# Patient Record
Sex: Female | Born: 1990 | Race: White | Hispanic: No | Marital: Single | Smoking: Current some day smoker
Health system: Southern US, Community
[De-identification: ages and names within clinical notes are randomized; demographics above are authoritative.]

## PROBLEM LIST (undated history)

## (undated) DIAGNOSIS — D690 Allergic purpura: Secondary | ICD-10-CM

## (undated) DIAGNOSIS — J189 Pneumonia, unspecified organism: Secondary | ICD-10-CM

## (undated) DIAGNOSIS — F319 Bipolar disorder, unspecified: Secondary | ICD-10-CM

## (undated) DIAGNOSIS — K900 Celiac disease: Secondary | ICD-10-CM

## (undated) HISTORY — DX: Pneumonia, unspecified organism: J18.9

## (undated) HISTORY — DX: Allergic purpura: D69.0

---
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
## 2010-12-02 ENCOUNTER — Ambulatory Visit (INDEPENDENT_AMBULATORY_CARE_PROVIDER_SITE_OTHER): Payer: BC Managed Care – PPO | Admitting: Internal Medicine

## 2010-12-02 ENCOUNTER — Encounter: Payer: Self-pay | Admitting: Internal Medicine

## 2010-12-02 VITALS — BP 120/80 | HR 78 | Ht 65.5 in | Wt 180.0 lb

## 2010-12-02 DIAGNOSIS — R609 Edema, unspecified: Secondary | ICD-10-CM

## 2010-12-02 DIAGNOSIS — R140 Abdominal distension (gaseous): Secondary | ICD-10-CM | POA: Insufficient documentation

## 2010-12-02 DIAGNOSIS — Z8379 Family history of other diseases of the digestive system: Secondary | ICD-10-CM

## 2010-12-02 DIAGNOSIS — Z13220 Encounter for screening for lipoid disorders: Secondary | ICD-10-CM

## 2010-12-02 DIAGNOSIS — R143 Flatulence: Secondary | ICD-10-CM

## 2010-12-02 DIAGNOSIS — R635 Abnormal weight gain: Secondary | ICD-10-CM

## 2010-12-02 NOTE — Progress Notes (Signed)
  Subjective:    Patient ID: Laurie Stevens, female    DOB: [birth year], 20 y.o.   MRN: [id number]  HPI Patient comes in today with mom  for the above problems. Her last visit with [place] was almost 3 years ago. She is now finished her schooling in [place] and is living or an apartment with her boyfriend. She is down visiting her mother with some of these complaints and so office visit was made for about. Going back to [place] in a few days. On sprintec  For  2 month.  Seems to be doing well on this   Complaint of Digestion problem since November. 2011 described as constipation  Bloated and legs swell easy. Low sodium diet helps some.   Unrestored sleep.  At times some snoring.   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    No  Change in habits.  Gained 30 pounds  Since  November  Runs and  Exercises.  Diet tends to drink a lot of milk does eat nuts does not eat out much healthier than in the past.  Review of Systems Negative for chest pain shortness of breath does have a rash on her chest at times but not today. Irregular on medicine. No depression unusual joint swelling. She states that she has sometimes weight fluctuations of 4-5 pounds in a day.  She's gotten congested since she got back to [place] with her allergies.  Past Medical History  Diagnosis Date  . 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 Pneumonia 05/04    RML   No past surgical history on file.  reports that she has never smoked. She does not have any smokeless tobacco history on file. She reports that she drinks alcohol. She reports that she does not use illicit drugs. family history includes Celiac disease in her sister; Diabetes type II in an unspecified family member; Hypertension in her maternal grandmother and unspecified family member; and Thyroid disease in her sister. No Known Allergies       Objective:   Physical Exam Well-developed well-nourished in no acute distress. Mild upper respiratory congestion HEENT: Normocephalic ;atraumatic , Eyes;  PERRL, EOMs  Full, lids and conjunctiva clear,,Ears: no deformities, canals nl, TM landmarks normal, Nose: no deformity   Minimal mucoid clear  discharge  Mouth : OP clear without lesion or edema .tonsils 1-2 hypertrophied  Neck: no adenopathy. Chest:  Clear to A&P without wheezes rales or rhonchi CV:  S1-S2 no gallops or murmurs peripheral perfusion is normal Abdomen:  Sof,t normal bowel sounds without hepatosplenomegaly, no guarding rebound or masses no CVA tenderness Skin no acute rashes or petechia    Ext 1+ edema normal pulses and no skin changes       Assessment & Plan:  Weight gain Bloating  Edema comes nad goes  Better ? If lymphedema but no chronic changes   Sib has thyroid and celiac dx   Advise  Diet and exercise and labs.  Consider  Lactose intolerance . Call if has OSA sx.   Set up with PCP in [place] area .  Sign form  Mom can get information.

## 2010-12-02 NOTE — Patient Instructions (Signed)
Will notify you  of labs when available. Continue low salt diet and  Exercise.  Increase fiber   For constipation . Consider limiting mild or doing a lactose free diet for a few weeks and then add back  Some.  Yogurt does better than milk.

## 2010-12-03 LAB — HEPATIC FUNCTION PANEL
ALT: 14 U/L (ref 0–35)
AST: 15 U/L (ref 0–37)
Albumin: 3.2 g/dL — ABNORMAL LOW (ref 3.5–5.2)
Total Bilirubin: 0.2 mg/dL — ABNORMAL LOW (ref 0.3–1.2)

## 2010-12-03 LAB — BASIC METABOLIC PANEL
BUN: 12 mg/dL (ref 6–23)
CO2: 27 mEq/L (ref 19–32)
Chloride: 106 mEq/L (ref 96–112)
Glucose, Bld: 80 mg/dL (ref 70–99)
Potassium: 4.1 mEq/L (ref 3.5–5.1)

## 2010-12-03 LAB — CBC WITH DIFFERENTIAL/PLATELET
Basophils Relative: 3.3 % — ABNORMAL HIGH (ref 0.0–3.0)
Eosinophils Relative: 1.8 % (ref 0.0–5.0)
Lymphocytes Relative: 23.5 % (ref 12.0–46.0)
Monocytes Relative: 5.5 % (ref 3.0–12.0)
Neutrophils Relative %: 65.9 % (ref 43.0–77.0)
RBC: 4.2 Mil/uL (ref 3.87–5.11)
WBC: 8.6 10*3/uL (ref 4.5–10.5)

## 2010-12-03 LAB — LIPID PANEL
Total CHOL/HDL Ratio: 4
Triglycerides: 85 mg/dL (ref 0.0–149.0)

## 2010-12-03 LAB — T4, FREE: Free T4: 0.91 ng/dL (ref 0.60–1.60)

## 2010-12-08 ENCOUNTER — Encounter: Payer: Self-pay | Admitting: *Deleted

## 2010-12-09 LAB — CELIAC PANEL 10
Endomysial Screen: NEGATIVE
Gliadin IgA: 19.1 U/mL (ref <20)
Tissue Transglut Ab: 13.2 U/mL (ref <20)

## 2010-12-10 ENCOUNTER — Telehealth: Payer: Self-pay | Admitting: *Deleted

## 2010-12-10 NOTE — Telephone Encounter (Signed)
Mom would like pt's lab results, please.

## 2010-12-10 NOTE — Telephone Encounter (Signed)
Pt aware of results 

## 2016-01-24 ENCOUNTER — Ambulatory Visit: Payer: Self-pay | Admitting: Internal Medicine

## 2016-01-24 ENCOUNTER — Ambulatory Visit (INDEPENDENT_AMBULATORY_CARE_PROVIDER_SITE_OTHER): Payer: PRIVATE HEALTH INSURANCE | Admitting: Internal Medicine

## 2016-01-24 ENCOUNTER — Encounter: Payer: Self-pay | Admitting: Internal Medicine

## 2016-01-24 VITALS — BP 118/80 | Temp 99.0°F | Ht 65.5 in | Wt 155.2 lb

## 2016-01-24 DIAGNOSIS — R238 Other skin changes: Secondary | ICD-10-CM

## 2016-01-24 DIAGNOSIS — R140 Abdominal distension (gaseous): Secondary | ICD-10-CM

## 2016-01-24 DIAGNOSIS — Z8379 Family history of other diseases of the digestive system: Secondary | ICD-10-CM | POA: Diagnosis not present

## 2016-01-24 DIAGNOSIS — R233 Spontaneous ecchymoses: Secondary | ICD-10-CM

## 2016-01-24 LAB — CBC WITH DIFFERENTIAL/PLATELET
BASOS ABS: 81 {cells}/uL (ref 0–200)
Basophils Relative: 1 %
EOS ABS: 324 {cells}/uL (ref 15–500)
Eosinophils Relative: 4 %
HEMATOCRIT: 39.3 % (ref 35.0–45.0)
HEMOGLOBIN: 12.6 g/dL (ref 11.7–15.5)
LYMPHS ABS: 2187 {cells}/uL (ref 850–3900)
Lymphocytes Relative: 27 %
MCH: 30.7 pg (ref 27.0–33.0)
MCHC: 32.1 g/dL (ref 32.0–36.0)
MCV: 95.9 fL (ref 80.0–100.0)
MONO ABS: 729 {cells}/uL (ref 200–950)
MPV: 11.9 fL (ref 7.5–12.5)
Monocytes Relative: 9 %
NEUTROS PCT: 59 %
Neutro Abs: 4779 cells/uL (ref 1500–7800)
Platelets: 277 10*3/uL (ref 140–400)
RBC: 4.1 MIL/uL (ref 3.80–5.10)
RDW: 13.3 % (ref 11.0–15.0)
WBC: 8.1 10*3/uL (ref 3.8–10.8)

## 2016-01-24 LAB — PROTIME-INR
INR: 1
PROTHROMBIN TIME: 10.5 s (ref 9.0–11.5)

## 2016-01-24 LAB — APTT: aPTT: 26 s (ref 22–34)

## 2016-01-24 NOTE — Patient Instructions (Signed)
Will notify you  of labs when available.   May want to try off of the  Omega 3 incase adding the bleeding tendency . If lab ok and continues  We ,may get  A specialist  Hematologist to give opinion .  However your exam and hx is reassuring today .

## 2016-01-24 NOTE — Progress Notes (Signed)
Pre visit review using our clinic review tool, if applicable. No additional management support is needed unless otherwise documented below in the visit note.  Chief Complaint  Patient presents with  . Bruising  . Bilateral Leg Swelling    HPI: Laurie Stevens 25 y.o.  Last seen over 5 years ago comes in today for acute visit problem . She is now living in [place] but transitioning to move and at home for a while.  She comes in today at encouragement of her mom to look at her easy bruisability. She states she tends to bruise easily when things touch her skin. No specific bleeding otherwise.  Periods  Heavy first day and then barely  Otherwise . Normal  No nose bleeds  Sometimes bleed when brush teeth.    No recenet  Trips.  ibufprofen 4 per day when knee pain bad.  joint pain .  Knees  Both kness right is worse  . No swelling and exercising every day   Tends to get lower extremity leg swelling ever since she had at bedtime purpura years ago wears compression stockings at times and doesn't get bruising is much when she does that. Stomach  isses   Going vegan  Because of this .  Better now . When she avoids red meat and dairy. However she does take womens multivitamin  ,Omega 3     One  Day .  ROS: See pertinent positives and negatives per HPI. No chest pain shortness of breath otherwise difficulties since last seen.  Past Medical History  Diagnosis Date  . HSP ([facility]lein purpura) ([place]) 05/99    hs no renal involvement hosp with abd pain [place] rx  with prednisone  . Pneumonia 05/04    RML    Family History  Problem Relation Age of Onset  . Celiac disease Sister   . Thyroid disease Sister     autoimmune disease  . Diabetes type II    . Hypertension    . Hypertension Maternal Grandmother     Social History   Social History  . Marital Status: Single    Spouse Name: N/A  . Number of Children: N/A  . Years of Education: N/A   Social History Main Topics  . Smoking  status: Never Smoker   . Smokeless tobacco: Never Used  . Alcohol Use: No     Comment: rarely  . Drug Use: Yes     Comment: Marijuana  . Sexual Activity: Not Asked   Other Topics Concern  . 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  GENERAL: vitals reviewed and listed above, alert, oriented, appears well hydrated and in no acute distress HEENT: atraumatic, conjunctiva  clear, no obvious abnormalities on inspection of external  nose and ears OP : no lesion edema or exudate  NECK: no obvious masses on inspection palpation  LUNGS: clear to auscultation bilaterally, no wheezes, rales or rhonchi, good air movement CV: HRRR, no clubbing cyanosis or  peripheral edema nl cap refill  Abdomen:  Sof,t normal bowel sounds without hepatosplenomegaly, no guarding rebound or masses no CVA tenderness MS: moves all extremities without noticeable focal  Abnormality SKIN: Obvious ecchymosis pretty dramatic on both lower extremities asymmetrical from the knee down minimally tender no breaks in skin no petechiae. There is a linear ecchymosis on both anterior thighs where she states her laptop sitting for a while. Slight edema of legs nonpitting. Pulses appear intact pierced lip  No bruising scarrin PSYCH: pleasant and cooperative, no obvious depression or anxiety  ASSESSMENT AND PLAN:  Discussed the following assessment and plan:  Easy bruisability - Although lower extremity  pretty impressive amount of bruising from minimal contact including bruise related to laptop sitting on her thighs. Rest of exam normal - Plan: Basic metabolic panel, Hepatic function panel, TSH, Celiac panel 10, Protime-INR, Sedimentation rate, APTT, CBC with Differential/Platelet, CANCELED: CBC with Differential/Platelet  Family history of celiac disease  Abdominal bloating - better on vegan diet  taking  vitamins Plan follow up depending on results etc   Pat on menses today so no UA obtained  .  -Patient advised to return or notify health care team  if symptoms worsen ,persist or new concerns arise.  Patient Instructions  Will notify you  of labs when available.   May want to try off of the  Omega 3 incase adding the bleeding tendency . If lab ok and continues  We ,may get  A specialist  Hematologist to give opinion .  However your exam and hx is reassuring today .     Standley Brooking. Panosh M.D.

## 2016-01-25 LAB — HEPATIC FUNCTION PANEL
ALK PHOS: 51 U/L (ref 33–115)
ALT: 15 U/L (ref 6–29)
AST: 13 U/L (ref 10–30)
Albumin: 4.2 g/dL (ref 3.6–5.1)
BILIRUBIN DIRECT: 0.2 mg/dL (ref <=0.2)
BILIRUBIN INDIRECT: 0.5 mg/dL (ref 0.2–1.2)
BILIRUBIN TOTAL: 0.7 mg/dL (ref 0.2–1.2)
Total Protein: 6.9 g/dL (ref 6.1–8.1)

## 2016-01-25 LAB — TSH: TSH: 1.48 mIU/L

## 2016-01-25 LAB — BASIC METABOLIC PANEL
BUN: 10 mg/dL (ref 7–25)
CALCIUM: 9.3 mg/dL (ref 8.6–10.2)
CO2: 22 mmol/L (ref 20–31)
Chloride: 106 mmol/L (ref 98–110)
Creat: 0.81 mg/dL (ref 0.50–1.10)
Glucose, Bld: 82 mg/dL (ref 65–99)
POTASSIUM: 4 mmol/L (ref 3.5–5.3)
SODIUM: 140 mmol/L (ref 135–146)

## 2016-01-25 LAB — SEDIMENTATION RATE: Sed Rate: 1 mm/hr (ref 0–20)

## 2016-01-27 LAB — CELIAC PANEL 10
Endomysial Screen: NEGATIVE
GLIADIN IGA: 8 U (ref <20)
GLIADIN IGG: 5 U (ref <20)
IgA: 369 mg/dL (ref 81–463)
TISSUE TRANSGLUT AB: 1 U/mL (ref <6)
TISSUE TRANSGLUTAMINASE AB, IGA: 5 U/mL — AB (ref <4)

## 2016-03-03 ENCOUNTER — Emergency Department (HOSPITAL_COMMUNITY)
Admission: EM | Admit: 2016-03-03 | Discharge: 2016-03-04 | Disposition: A | Discharge status: Other Acute Inpatient Hospital | Payer: PRIVATE HEALTH INSURANCE | Attending: Emergency Medicine | Admitting: Emergency Medicine

## 2016-03-03 ENCOUNTER — Encounter (HOSPITAL_COMMUNITY): Payer: Self-pay | Admitting: Emergency Medicine

## 2016-03-03 DIAGNOSIS — R440 Auditory hallucinations: Secondary | ICD-10-CM | POA: Diagnosis present

## 2016-03-03 DIAGNOSIS — F3113 Bipolar disorder, current episode manic without psychotic features, severe: Secondary | ICD-10-CM | POA: Diagnosis present

## 2016-03-03 DIAGNOSIS — F22 Delusional disorders: Secondary | ICD-10-CM

## 2016-03-03 DIAGNOSIS — R443 Hallucinations, unspecified: Secondary | ICD-10-CM

## 2016-03-03 DIAGNOSIS — F918 Other conduct disorders: Secondary | ICD-10-CM | POA: Diagnosis not present

## 2016-03-03 DIAGNOSIS — R4689 Other symptoms and signs involving appearance and behavior: Secondary | ICD-10-CM

## 2016-03-03 DIAGNOSIS — F1290 Cannabis use, unspecified, uncomplicated: Secondary | ICD-10-CM | POA: Diagnosis not present

## 2016-03-03 DIAGNOSIS — Z79899 Other long term (current) drug therapy: Secondary | ICD-10-CM | POA: Diagnosis not present

## 2016-03-03 LAB — COMPREHENSIVE METABOLIC PANEL
ALK PHOS: 60 U/L (ref 38–126)
ALT: 15 U/L (ref 14–54)
ANION GAP: 8 (ref 5–15)
AST: 17 U/L (ref 15–41)
Albumin: 4.4 g/dL (ref 3.5–5.0)
BUN: 8 mg/dL (ref 6–20)
CALCIUM: 8.8 mg/dL — AB (ref 8.9–10.3)
CHLORIDE: 104 mmol/L (ref 101–111)
CO2: 23 mmol/L (ref 22–32)
CREATININE: 0.58 mg/dL (ref 0.44–1.00)
Glucose, Bld: 112 mg/dL — ABNORMAL HIGH (ref 65–99)
Potassium: 3.4 mmol/L — ABNORMAL LOW (ref 3.5–5.1)
Sodium: 135 mmol/L (ref 135–145)
Total Bilirubin: 0.7 mg/dL (ref 0.3–1.2)
Total Protein: 7.3 g/dL (ref 6.5–8.1)

## 2016-03-03 LAB — CBC WITH DIFFERENTIAL/PLATELET
BASOS ABS: 0 10*3/uL (ref 0.0–0.1)
Basophils Relative: 0 %
EOS ABS: 0 10*3/uL (ref 0.0–0.7)
EOS PCT: 0 %
HCT: 38 % (ref 36.0–46.0)
Hemoglobin: 12.9 g/dL (ref 12.0–15.0)
LYMPHS PCT: 12 %
Lymphs Abs: 1.5 10*3/uL (ref 0.7–4.0)
MCH: 31.9 pg (ref 26.0–34.0)
MCHC: 33.9 g/dL (ref 30.0–36.0)
MCV: 94.1 fL (ref 78.0–100.0)
MONO ABS: 1.1 10*3/uL — AB (ref 0.1–1.0)
Monocytes Relative: 9 %
Neutro Abs: 10 10*3/uL — ABNORMAL HIGH (ref 1.7–7.7)
Neutrophils Relative %: 79 %
PLATELETS: 258 10*3/uL (ref 150–400)
RBC: 4.04 MIL/uL (ref 3.87–5.11)
RDW: 12.6 % (ref 11.5–15.5)
WBC: 12.7 10*3/uL — AB (ref 4.0–10.5)

## 2016-03-03 LAB — ETHANOL: Alcohol, Ethyl (B): <5 mg/dL (ref <5)

## 2016-03-03 LAB — RAPID URINE DRUG SCREEN, HOSP PERFORMED
AMPHETAMINES: NOT DETECTED
BARBITURATES: NOT DETECTED
BENZODIAZEPINES: POSITIVE — AB
COCAINE: NOT DETECTED
Opiates: NOT DETECTED
TETRAHYDROCANNABINOL: POSITIVE — AB

## 2016-03-03 LAB — ACETAMINOPHEN LEVEL

## 2016-03-03 LAB — SALICYLATE LEVEL

## 2016-03-03 LAB — PREGNANCY, URINE: Preg Test, Ur: NEGATIVE

## 2016-03-03 MED ORDER — ZIPRASIDONE MESYLATE 20 MG IM SOLR
10.0000 mg | Freq: Once | INTRAMUSCULAR | Status: AC
Start: 2016-03-03 — End: 2016-03-03
  Administered 2016-03-03: 10 mg via INTRAMUSCULAR
  Filled 2016-03-03: qty 20

## 2016-03-03 MED ORDER — DIPHENHYDRAMINE HCL 50 MG/ML IJ SOLN
50.0000 mg | Freq: Once | INTRAMUSCULAR | Status: AC
  Administered 2016-03-03: 50 mg via INTRAMUSCULAR
  Filled 2016-03-03: qty 1

## 2016-03-03 MED ORDER — OLANZAPINE 10 MG IM SOLR
10.0000 mg | Freq: Once | INTRAMUSCULAR | Status: AC
  Administered 2016-03-03: 10 mg via INTRAMUSCULAR
  Filled 2016-03-03: qty 10

## 2016-03-03 MED ORDER — LORAZEPAM 2 MG/ML IJ SOLN
2.0000 mg | Freq: Once | INTRAMUSCULAR | Status: AC
  Administered 2016-03-03: 2 mg via INTRAMUSCULAR
  Filled 2016-03-03: qty 1

## 2016-03-03 MED ORDER — ZIPRASIDONE MESYLATE 20 MG IM SOLR: 10.0000 mg | Freq: Once | INTRAMUSCULAR | Status: DC

## 2016-03-03 MED ORDER — OLANZAPINE 10 MG PO TBDP
10.0000 mg | ORAL_TABLET | Freq: Two times a day (BID) | ORAL | Status: DC
  Administered 2016-03-03 – 2016-03-04 (×2): 10 mg via ORAL
  Filled 2016-03-03 (×2): qty 1

## 2016-03-03 NOTE — ED Notes (Signed)
PT would not response when info her about urine sample

## 2016-03-03 NOTE — ED Notes (Signed)
Pt woke up and her behaviors have been escalating leading to her going into another pt's room and resisting staff's attempts to get her out of that pt's room. She has tried to grab [employer] rover when her vital signs were taken. She has talked on the telephone with no one on the other end of the telephone.

## 2016-03-03 NOTE — BH Assessment (Addendum)
TTS consult ordered for this patient. Attempted the TTS assessment however patient was sedated and unable to be aroused. Writer asked Barrett Henleicole Elizabeth Nadeau, PA-C to remove consult and re-enter when patient is able to be appropriately assessed. Writer also updated [place], NP that TTS consult was removed and will need to be re-entered once patient awakens.

## 2016-03-03 NOTE — ED Notes (Signed)
When pt arrived on the [facility] she was agitated, going from room to room, slamming doors, pulling the dirty laundry out of the laundry bin, trying to get out of the doors, crying hysterically at times, and dancing at other times. She was not logical, not redirectable and not consolable. Medications ordered to help pt calm down and they were given with a show of support from staff. Pt currently resting comfortably, respirations even, regular and unlabored, with eyes closed. Will monitor closely.

## 2016-03-03 NOTE — ED Notes (Signed)
Pt's mother arrived and brought a bag of clothing([facility] to [facility] NT).  This Consulting civil engineerCharge RN spoke with her and explained why we are restricting visitors.  Pt's mother was calm, cooperative, and verbalized understanding.  Further, Pt's mother reports that the Pt broke up w/ her fiancee x 3 months ago.  Sts she moved back to [place] from [place], "because she could not take living up there.  She would just sit in her room and cry."  Sts that Pt's sister is getting married soon and "all of this wedding planning is putting her over the edge."  Sts "she has always had anxiety issues, but I've been able to calm her down.  I found out last night that she has been using marijuana, which was suggested by a friend, to try to calm her anxiety."   Pt's mother: Donalynn FurlongLinda Jones [phone].

## 2016-03-03 NOTE — ED Notes (Signed)
Bed: WA03 Expected date:  Expected time:  Means of arrival:  Comments: Behavior issues, aggressive

## 2016-03-03 NOTE — ED Triage Notes (Signed)
Pt comes to ed via ems from home, mother filling for IVC, pt had recent break up, is yelling screaming. Police arrived on seen detained pt and called ems. Pt is currently in handcuffs. 5 midazolam Im given by EMS.   V/s 108/63, pulse 101, rr18 , spo2 98 percent cbg 131.  No recorded behavorial health hx on file.

## 2016-03-03 NOTE — ED Notes (Signed)
Officer at bedside reports pt ran out of the house early this am and her family couldn't find her.  About a week ago, her and her boyfriend broke up and she has been having problems since.  She was found behind their house behind some buses.  When they found her, she started to run out into the streets, laid in the grass and became combative and aggressive.  Pt was handcuffed during assessment.  Pt states "I am living in a fake world.  I just need love, everyone hates eachother."  Pt appears disheveled.  Handcuffed removed.  Pt instructed to lay down and rest.  Pt is calm and cooperative at this time.  Pt is A&O x 3.  States the year is [id number].

## 2016-03-03 NOTE — ED Notes (Signed)
Pt medicated per Juanito DoomJamison Lord's, NP, orders. Pt was resistant and required assistance from security and police to administer the medication. They physically escorted her to her room and held her while the medications were given.

## 2016-03-03 NOTE — BH Assessment (Signed)
Assessment Note  Laurie Stevens is an 25 y.o. female. Patient presents to [facility].  She lived in [place] for 8 years with her boyfriend. Sts that boyfriend broke up with her so she moved to [place] to live with her mother. Patient's sister is always getting married this weekend. patient is under a lot of stress helping her sister prepare for he wedding. Early this morning patient was missing and found hiding behind bushes and behind some buses. When she was found patient started to run out into the streets, laid on the grass, and became combative.   Writer met with patient face to face. She was resistant and refused to cooperate with the assessment. Patient stating, "I don't deserve to be here.Marland KitchenMarland KitchenI am not crazy..I am not Schizophrenic". Patient refused to cooperative any further. Writer unable to determine SI or HI. Patient did not confirm or deny. Patient did however deny AVH's. Writer observed patient responding to internal stimuli. Patient became agitated, not logical, tearful at times, and needed redirection. Patient does report a history of cocaine and alcohol use. She reports recent history of THC use.    Diagnosis: Bipolar Disorder, Manic Episode  Past Medical History:  Past Medical History:  Diagnosis Date  . 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 Pneumonia 05/04   RML    History reviewed. No pertinent surgical history.  Family History:  Family History  Problem Relation Age of Onset  . Celiac disease Sister   . Thyroid disease Sister     autoimmune disease  . Diabetes type II    . Hypertension    . Hypertension Maternal Grandmother     Social History:  reports that she has never smoked. She has never used smokeless tobacco. She reports that she uses drugs. She reports that she does not drink alcohol.  Additional Social History:  Alcohol / Drug Use Pain Medications: SEE MAR Prescriptions: SEE MAR Over the  Counter: SEE MAR History of alcohol / drug use?: Yes Substance #1 Name of Substance 1: Cocaine  1 - Age of First Use: unk 1 - Amount (size/oz): unk 1 - Frequency: unk 1 - Duration: unk 1 - Last Use / Amount: "last year" Substance #2 Name of Substance 2: THC 2 - Age of First Use: unk 2 - Amount (size/oz): unk 2 - Frequency: unk 2 - Duration: unk 2 - Last Use / Amount: "I don't remember" Substance #3 Name of Substance 3: Alcohol  3 - Age of First Use: unk 3 - Amount (size/oz): "A shot" 3 - Frequency: social drinker 3 - Duration: on-going  3 - Last Use / Amount: March 2017  CIWA: CIWA-Ar BP: 123/73 Pulse Rate: 87 COWS:    Allergies:  Allergies  Allergen Reactions  . Lactose Intolerance (Gi) Diarrhea    Home Medications:  (Not in a hospital admission)  OB/GYN Status:  No LMP recorded.  General Assessment Data [facility] of Assessment: [facility] TTS Assessment: In system Is this a Tele or Face-to-Face Assessment?: Face-to-Face Is this an Initial Assessment or a Re-assessment for this encounter?: Initial Assessment Marital status: Single Maiden name:  (n/a) Is patient pregnant?: No Pregnancy Status: No Living Arrangements: Other (Comment) (patient lives ) Can pt return to current living arrangement?: No Admission Status: Voluntary Is patient capable of signing voluntary admission?: Yes Referral Source: Self/Family/Friend Insurance type:  Environmental education officer)     Crisis Care Plan Living Arrangements:  Other (Comment) (patient lives ) Legal Guardian: Other: (no legal guardian ) Name of Psychiatrist:  (no psychiatrist ) Name of Therapist:  (no therapist )  Education Status Is patient currently in school?: No Current Grade:  (n/a) Highest grade of school patient has completed:  (some college) Name of school:  (n/a) Contact person:  (n/a)  Risk to self with the past 6 months Suicidal Ideation: No Has patient been a risk to self within the past 6 months prior to admission?  : No Suicidal Intent: No Has patient had any suicidal intent within the past 6 months prior to admission? : No Is patient at risk for suicide?: No Suicidal Plan?: No Has patient had any suicidal plan within the past 6 months prior to admission? : No Access to Means: No What has been your use of drugs/alcohol within the last 12 months?:  (patient reports a history of cocaine, thc, and alcohol use) Previous Attempts/Gestures: Yes How many times?:  (1x; 2011; overdosed ) Other Self Harm Risks:  (denies ) Triggers for Past Attempts: Other (Comment) ("A guy lied to me") Intentional Self Injurious Behavior: None Family Suicide History: No Recent stressful life event(s): Other (Comment) (boyfriend ) Depression: No Depression Symptoms: Feeling angry/irritable, Feeling worthless/self pity, Loss of interest in usual pleasures, Fatigue, Guilt, Isolating, Tearfulness, Insomnia, Despondent Substance abuse history and/or treatment for substance abuse?: No Suicide prevention information given to non-admitted patients: Not applicable  Risk to Others within the past 6 months Homicidal Ideation: No Does patient have any lifetime risk of violence toward others beyond the six months prior to admission? : No Thoughts of Harm to Others: No Current Homicidal Intent: No Current Homicidal Plan: No-Not Currently/Within Last 6 Months Access to Homicidal Means: No Identified Victim:  (n/a) History of harm to others?: No Assessment of Violence: None Noted Violent Behavior Description:  (patient with manic like symptoms; uncooperative at times ) Does patient have access to weapons?: No Criminal Charges Pending?: No Does patient have a court date: No Is patient on probation?: No  Psychosis Hallucinations:  (patient responding to internal stimuli thoughtout the assess) Delusions: Unspecified  Mental Status Report Appearance/Hygiene: In scrubs Eye Contact: Poor Motor Activity: Agitation,  Restlessness Speech:  (pressured ) Level of Consciousness: Alert Mood: Depressed, Anxious, Irritable, Preoccupied Affect: Apprehensive, Preoccupied, Anxious Anxiety Level: Severe Thought Processes: Circumstantial, Irrelevant, Relevant, Flight of Ideas Judgement: Impaired Orientation: Person, Place Obsessive Compulsive Thoughts/Behaviors: Moderate  Cognitive Functioning Concentration: Decreased Memory: Remote Intact, Recent Intact IQ: Average Insight: Poor Impulse Control: Poor Appetite: Poor Weight Loss:  (none reported) Weight Gain:  (none reported) Sleep: Decreased Total Hours of Sleep:  ("I don't sleep for days at a time") Vegetative Symptoms: None  ADLScreening [facility] Assessment Services) Patient's cognitive ability adequate to safely complete daily activities?: Yes Patient able to express need for assistance with ADLs?: Yes Independently performs ADLs?: Yes (appropriate for developmental age)  Prior Inpatient Therapy Prior Inpatient Therapy: No Prior Therapy Dates:  (n/a) Prior Therapy Facilty/Provider(s):  (n/a) Reason for Treatment:  (n/a)  Prior Outpatient Therapy Prior Outpatient Therapy: No Prior Therapy Dates:  (n/a) Prior Therapy Facilty/Provider(s):  (n/a) Reason for Treatment:  (n/a) Does patient have an ACCT team?: No Does patient have Intensive In-House Services?  : No Does patient have [facility] services? : No Does patient have P4CC services?: No  ADL Screening (condition at time of admission) Patient's cognitive ability adequate to safely complete daily activities?: Yes Is the patient deaf or have difficulty hearing?: No Does the  patient have difficulty seeing, even when wearing glasses/contacts?: No Does the patient have difficulty concentrating, remembering, or making decisions?: No Patient able to express need for assistance with ADLs?: Yes Does the patient have difficulty dressing or bathing?: No Independently performs ADLs?: Yes (appropriate for  developmental age) Does the patient have difficulty walking or climbing stairs?: No Weakness of Legs: None Weakness of Arms/Hands: None  Home Assistive Devices/Equipment Home Assistive Devices/Equipment: None    Abuse/Neglect Assessment (Assessment to be complete while patient is alone) Physical Abuse: Denies Verbal Abuse: Denies Sexual Abuse: Denies Exploitation of patient/patient's resources: Denies Self-Neglect: Denies Values / Beliefs Cultural Requests During Hospitalization: None Spiritual Requests During Hospitalization: None   Advance Directives (For Healthcare) Does patient have an advance directive?: No Would patient like information on creating an advanced directive?: No - patient declined information Nutrition Screen- [facility] Adult/[facility] Patient's home diet: Regular  Additional Information 1:1 In Past 12 Months?: No CIRT Risk: No Elopement Risk: No Does patient have medical clearance?: Yes     Disposition:  Disposition Initial Assessment Completed for this Encounter: Yes Disposition of Patient: Inpatient treatment program (Patient meets criteria for INPT treatment)  On Site Evaluation by:   Reviewed with Physician:    Evangeline Gula 03/03/2016 7:09 PM

## 2016-03-03 NOTE — ED Notes (Signed)
PT belonging at nurse station (1-8)

## 2016-03-03 NOTE — ED Provider Notes (Signed)
[facility] DEPT Provider Note   CSN: [id number] Arrival date & time: 03/03/16  0708     History   Chief Complaint Chief Complaint  Patient presents with  . Medical Clearance    HPI Laurie Stevens is a 25 y.o. female.  Patient is a 25 year old female with no pertinent past medical history who presents to the ED via [facility] under IVC. [facility] reports that patient's mother filed an IVC due to the patient having abnormal behavior and being a harm to herself. Patient reports she has been having problems with her family and notes that they are scared of her. Patient denies doing anything to physically harm her family but states she thinks certain things she says makes him scared of her. Patient reports having intermittent thoughts of suicide but denies any current plan or prior attempts. Patient reports "I am living in a fake world and I just need love, everyone hates each other". Patient endorses having auditory hallucinations and notes "I can feel the rhythm in people's relationships". Patient states she has smoked marijuana and cocaine in the past but denies any recent drug or alcohol use. Denies any pain or complaints at this time. [facility] reports that the patient's mother and neighbor states since the patient has returned home after breaking up with her fianc a few months ago she has been behaving abnormally. Patient has been noted to be laying out in the field in the middle of the night. [facility] states that tonight after the patient was found laying in an open field, when her sister came to pick her up she became angry and agitated and began running out into the streets and became combative.      Past Medical History:  Diagnosis Date  . HSP (Henoch Schonlein purpura) ([place]) 05/99   hs no renal involvement hosp with abd pain [place] rx  with prednisone  . Pneumonia 05/04   RML    Patient Active Problem List   Diagnosis Date Noted  . Abdominal bloating 12/02/2010  . Weight gain 12/02/2010  . Family  history of celiac disease 12/02/2010  . EDEMA 01/17/2008  . CHEST PAIN 01/17/2008  . 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 No pertinent surgical history.  OB History    No data available       Home Medications    Prior to Admission medications   Medication Sig Start Date End Date Taking? Authorizing Provider  Caffeine-Magnesium Salicylate (DIUREX) 63-785.8 MG TABS Take by mouth.   Yes Historical Provider, MD  Homeopathic Products (ALLERGY MEDICINE PO) Take 1 tablet by mouth every morning.   Yes Historical Provider, MD  Multiple Vitamin (MULTIVITAMIN WITH MINERALS) TABS tablet Take 1 tablet by mouth daily.   Yes Historical Provider, MD    Family History Family History  Problem Relation Age of Onset  . Celiac disease Sister   . Thyroid disease Sister     autoimmune disease  . Diabetes type II    . Hypertension    . Hypertension Maternal Grandmother     Social History Social History  Substance Use Topics  . Smoking status: Never Smoker  . Smokeless tobacco: Never Used  . Alcohol use No     Comment: rarely     Allergies   Lactose intolerance (gi)   Review of Systems Review of Systems  Psychiatric/Behavioral: Positive for behavioral problems and hallucinations.  All other systems reviewed and are negative.    Physical Exam Updated Vital Signs BP 108/55 (BP Location:  Left Arm)   Pulse 102   Temp 98.1 F (36.7 C) (Oral)   Resp 20   SpO2 100%   Physical Exam  Constitutional: She is oriented to person, place, and time. She appears well-developed and well-nourished.  HENT:  Head: Normocephalic and atraumatic.  Mouth/Throat: Oropharynx is clear and moist. No oropharyngeal exudate.  Eyes: Conjunctivae and EOM are normal. Pupils are equal, round, and reactive to light. Right eye exhibits no discharge. Left eye exhibits no discharge. No scleral icterus.  Neck: Normal range of motion. Neck supple.  Cardiovascular: Normal rate, regular rhythm,  normal heart sounds and intact distal pulses.   HR 92  Pulmonary/Chest: Effort normal and breath sounds normal. No respiratory distress. She has no wheezes. She has no rales. She exhibits no tenderness.  Abdominal: Soft. Bowel sounds are normal. She exhibits no distension and no mass. There is no tenderness. There is no rebound and no guarding. No hernia.  Musculoskeletal: She exhibits no edema.  Neurological: She is alert and oriented to person, place, and time.  Skin: Skin is warm and dry.  Psychiatric: Her affect is inappropriate. Her speech is delayed. She is aggressive, withdrawn and combative. Thought content is delusional. She expresses inappropriate judgment. She exhibits a depressed mood. She expresses suicidal ideation. She expresses no suicidal plans.  Nursing note and vitals reviewed.    ED Treatments / Results  Labs (all labs ordered are listed, but only abnormal results are displayed) Labs Reviewed  URINE RAPID DRUG SCREEN, HOSP PERFORMED - Abnormal; Notable for the following:       Result Value   Benzodiazepines POSITIVE (*)    Tetrahydrocannabinol POSITIVE (*)    All other components within normal limits  CBC WITH DIFFERENTIAL/PLATELET - Abnormal; Notable for the following:    WBC 12.7 (*)    Neutro Abs 10.0 (*)    Monocytes Absolute 1.1 (*)    All other components within normal limits  ACETAMINOPHEN LEVEL - Abnormal; Notable for the following:    Acetaminophen (Tylenol), Serum <10 (*)    All other components within normal limits  COMPREHENSIVE METABOLIC PANEL - Abnormal; Notable for the following:    Potassium 3.4 (*)    Glucose, Bld 112 (*)    Calcium 8.8 (*)    All other components within normal limits  PREGNANCY, URINE  ETHANOL  SALICYLATE LEVEL    EKG  EKG Interpretation None       Radiology No results found.  Procedures Procedures (including critical care time)  Medications Ordered in ED Medications  LORazepam (ATIVAN) injection 2 mg (2 mg  Intramuscular Given 03/03/16 0910)  ziprasidone (GEODON) injection 10 mg (10 mg Intramuscular Given 03/03/16 1236)  diphenhydrAMINE (BENADRYL) injection 50 mg (50 mg Intramuscular Given 03/03/16 1235)     Initial Impression / Assessment and Plan / ED Course  I have reviewed the triage vital signs and the nursing notes.  Pertinent labs & imaging results that were available during my care of the patient were reviewed by me and considered in my medical decision making (see chart for details).  Clinical Course    Patient presents with [facility] under IVC that was filed by her mother. Patient with recent changes in behavior causing her to be a harm to herself. Patient reports having intermittent suicidal ideation (denies plan), auditory hallucinations. VSS. On exam, patient with an appropriate affect, delayed speech, withdrawn and combative behavior, delusional thought content and depressed mood. Pt medically cleared. Consulted TTS. Dispo pending.   Final  Clinical Impressions(s) / ED Diagnoses   Final diagnoses:  Hallucinations  Delusions ([place])  Aggressive behavior    New Prescriptions New Prescriptions   No medications on file     Nona Dell, [place] 03/03/16 [place], MD 03/07/16 1326

## 2016-03-03 NOTE — Progress Notes (Signed)
Date: 03/03/16 Time: 1400 Location: [facility]  Group Topic: Wellness  Goal Area(s) Addresses:  Patient will get the opportunity for exercise. Patient will get the opportunity to engage with peers.  Intervention:  10 plastic cups, 1 sponge ball  Activity: Bowling.  LRT set up the cups like pins.  Each patient gets two tries per turn to knock down the 10 cups.  Each cup knocked down counts as 1 point.  The first person to reach 30 points wins.   Education: Wellness, Discharge Planning.   Education Outcome: Needs additional education.   Clinical Observations/Feedback: Pt did not attend group.     Cesiah Westley, LRT/CTRS  

## 2016-03-04 ENCOUNTER — Encounter (HOSPITAL_COMMUNITY): Payer: Self-pay

## 2016-03-04 ENCOUNTER — Encounter (HOSPITAL_COMMUNITY): Payer: Self-pay | Admitting: Emergency Medicine

## 2016-03-04 ENCOUNTER — Inpatient Hospital Stay (HOSPITAL_COMMUNITY)
Admission: AD | Admit: 2016-03-04 | Discharge: 2016-03-09 | DRG: 885 | Disposition: A | Discharge status: Home / Self Care | Payer: PRIVATE HEALTH INSURANCE | Attending: Psychiatry | Admitting: Psychiatry

## 2016-03-04 DIAGNOSIS — F3110 Bipolar disorder, current episode manic without psychotic features, unspecified: Secondary | ICD-10-CM | POA: Diagnosis present

## 2016-03-04 DIAGNOSIS — F3164 Bipolar disorder, current episode mixed, severe, with psychotic features: Secondary | ICD-10-CM | POA: Diagnosis present

## 2016-03-04 DIAGNOSIS — Z79899 Other long term (current) drug therapy: Secondary | ICD-10-CM | POA: Diagnosis not present

## 2016-03-04 DIAGNOSIS — Z8379 Family history of other diseases of the digestive system: Secondary | ICD-10-CM | POA: Diagnosis not present

## 2016-03-04 DIAGNOSIS — Z809 Family history of malignant neoplasm, unspecified: Secondary | ICD-10-CM | POA: Diagnosis not present

## 2016-03-04 DIAGNOSIS — F3113 Bipolar disorder, current episode manic without psychotic features, severe: Secondary | ICD-10-CM

## 2016-03-04 DIAGNOSIS — Z8249 Family history of ischemic heart disease and other diseases of the circulatory system: Secondary | ICD-10-CM | POA: Diagnosis not present

## 2016-03-04 DIAGNOSIS — F419 Anxiety disorder, unspecified: Secondary | ICD-10-CM | POA: Diagnosis present

## 2016-03-04 DIAGNOSIS — F22 Delusional disorders: Secondary | ICD-10-CM | POA: Diagnosis not present

## 2016-03-04 DIAGNOSIS — F1210 Cannabis abuse, uncomplicated: Secondary | ICD-10-CM | POA: Clinically undetermined

## 2016-03-04 DIAGNOSIS — Z818 Family history of other mental and behavioral disorders: Secondary | ICD-10-CM

## 2016-03-04 DIAGNOSIS — G4700 Insomnia, unspecified: Secondary | ICD-10-CM | POA: Diagnosis present

## 2016-03-04 DIAGNOSIS — Z833 Family history of diabetes mellitus: Secondary | ICD-10-CM

## 2016-03-04 MED ORDER — MAGNESIUM HYDROXIDE 400 MG/5ML PO SUSP: 30.0000 mL | Freq: Every day | ORAL | Status: DC | PRN

## 2016-03-04 MED ORDER — LORATADINE 10 MG PO TABS
10.0000 mg | ORAL_TABLET | Freq: Every day | ORAL | Status: DC
  Administered 2016-03-04 – 2016-03-09 (×6): 10 mg via ORAL
  Filled 2016-03-04 (×9): qty 1

## 2016-03-04 MED ORDER — ACETAMINOPHEN 325 MG PO TABS
650.0000 mg | ORAL_TABLET | Freq: Four times a day (QID) | ORAL | Status: DC | PRN
Start: 2016-03-04 — End: 2016-03-09
  Administered 2016-03-07: 650 mg via ORAL
  Filled 2016-03-04: qty 2

## 2016-03-04 MED ORDER — ENSURE ENLIVE PO LIQD
237.0000 mL | Freq: Two times a day (BID) | ORAL | Status: DC
  Administered 2016-03-04 – 2016-03-09 (×8): 237 mL via ORAL

## 2016-03-04 MED ORDER — ALUM & MAG HYDROXIDE-SIMETH 200-200-20 MG/5ML PO SUSP: 30.0000 mL | ORAL | Status: DC | PRN

## 2016-03-04 MED ORDER — ADULT MULTIVITAMIN W/MINERALS CH
1.0000 | ORAL_TABLET | Freq: Every day | ORAL | Status: DC
  Administered 2016-03-04 – 2016-03-09 (×6): 1 via ORAL
  Filled 2016-03-04 (×10): qty 1

## 2016-03-04 MED ORDER — OLANZAPINE 10 MG PO TBDP
10.0000 mg | ORAL_TABLET | Freq: Two times a day (BID) | ORAL | Status: DC
  Administered 2016-03-04 – 2016-03-05 (×2): 10 mg via ORAL
  Filled 2016-03-04 (×6): qty 1

## 2016-03-04 NOTE — Progress Notes (Signed)
Patient is friendly and smiling in room. Patient denies any feelings of SI, HI, and AVH. Patient denies any pain. Patient is calm and cooperative. Patient has light tremors present.   Patient remains safe on unit with q 15 monitoring, offered support, education, and encouragement. Medications were administered.   Patient is receptive and compliant, will continue to monitor.

## 2016-03-04 NOTE — ED Notes (Signed)
Patient appears confused and disorganized at this time. Patient is unable to participate in assessment. Will continue to monitoring patient. Encouragement and support provided and safety maintain. Q 15 min safety checks remain in place and video monitoring.

## 2016-03-04 NOTE — Progress Notes (Signed)
Admission note: Laurie Stevens is a 25 year old female being admitted involuntarily to [facility].  She came to the ED for bizarre behavior.  She recently moved back  to [place] from [place] after breaking up with her boyfriend of 8 years.  She reported that she had relationship issues with her boyfriend stemming from not being on medications.  "I couldn't give him the space that he needed to calm down."  She is currently living with her mother, father, sister and her children.  She reported having trouble dealing with everyone and the children.  She apparently went missing at home yesterday and was found in the bushes.  When she was found, she ran out into the streets, laid on the grass and became combative.  [facility].  She was combative in the ED requiring IM medications.   She is currently cooperative and reported that yesterday "I was just scared and didn't know what was going on."  She was unable to explain her circumstance prior to the hospital but stated "I know that I need to take medications."    She denies SI/HI or A/V hallucinations.  She is diagnosed with Bipolar, Manic episode.  Oriented her to the unit.  Admission paperwork completed and signed.  Belongings searched and placed in locker 49.  Skin search completed and bruising on both wrists from hand cuffs and bottom lip ring noted.  Q 15 minute checks initiated for safety.  We will monitor the progress towards her goals.

## 2016-03-04 NOTE — Tx Team (Signed)
Initial Interdisciplinary Treatment Plan   PATIENT STRESSORS: Financial difficulties Loss of relationship, boyfriend of 8 years Occupational concerns   PATIENT STRENGTHS: Capable of independent living Communication skills   PROBLEM LIST: Problem List/Patient Goals Date to be addressed Date deferred Reason deferred Estimated date of resolution  Psychosis 03/04/16     Anxiety 03/04/16     "I want to live amongst people" 03/04/16     "I want to be able to relax" 03/04/16                                    DISCHARGE CRITERIA:  Improved stabilization in mood, thinking, and/or behavior Verbal commitment to aftercare and medication compliance  PRELIMINARY DISCHARGE PLAN: Outpatient therapy Medication management  PATIENT/FAMIILY INVOLVEMENT: This treatment plan has been presented to and reviewed with the patient, Glendora ScoreKelly D Brannum.  The patient and family have been given the opportunity to ask questions and make suggestions.  Norm ParcelHeather V Yaniel Limbaugh 03/04/2016, 2:27 PM

## 2016-03-04 NOTE — ED Notes (Signed)
[facility] called for transport 

## 2016-03-04 NOTE — Progress Notes (Signed)
Assumed report about [name] from [place], RN at this time.

## 2016-03-04 NOTE — Consult Note (Signed)
[facility] Consult   Reason for Consult:  Mania  Referring Physician:  EDP Patient Identification: Laurie Stevens MRN:  [id number] Principal Diagnosis: Bipolar affective disorder, manic, severe ([place]) Diagnosis:   Patient Active Problem List   Diagnosis Date Noted  . Bipolar affective disorder, manic, severe ([place]) [F31.13] 03/04/2016    Priority: High  . Abdominal bloating [R14.0] 12/02/2010  . Weight gain [R63.5] 12/02/2010  . Family history of celiac disease [Z83.79] 12/02/2010  . EDEMA [R60.9] 01/17/2008  . CHEST PAIN [R07.9] 01/17/2008  . HENOCH-SCHONLEIN PURPURA [D69.0] 03/24/2007    Total Time spent with patient: 45 minutes  Subjective:   Laurie Stevens is a 25 y.o. female patient admitted with mania.  HPI:  25 yo female who presented to the ED with mania and multiple stressors.  She reports she felt a "hostile vibe" and reacted yesterday.  Laurie Stevens did sleep well last night and is much calmer today.  She does remember she was brought here for lying under the bushes in her neighbors yard but not sure if she understands this was not appropriate.  Denies suicidal/homicidal ideations and alcohol/drug abuse.  No hallucinations but remains tangential with pressured speech and increase in activity.  Past Psychiatric History: bipolar disorder  Risk to Self: Suicidal Ideation: No Suicidal Intent: No Is patient at risk for suicide?: No Suicidal Plan?: No Access to Means: No What has been your use of drugs/alcohol within the last 12 months?:  (patient reports a history of cocaine, thc, and alcohol use) How many times?:  (1x; 2011; overdosed ) Other Self Harm Risks:  (denies ) Triggers for Past Attempts: Other (Comment) ("A guy lied to me") Intentional Self Injurious Behavior: None Risk to Others: Homicidal Ideation: No Thoughts of Harm to Others: No Current Homicidal Intent: No Current Homicidal Plan: No-Not Currently/Within Last 6 Months Access to Homicidal  Means: No Identified Victim:  (n/a) History of harm to others?: No Assessment of Violence: None Noted Violent Behavior Description:  (patient with manic like symptoms; uncooperative at times ) Does patient have access to weapons?: No Criminal Charges Pending?: No Does patient have a court date: No Prior Inpatient Therapy: Prior Inpatient Therapy: No Prior Therapy Dates:  (n/a) Prior Therapy Facilty/Provider(s):  (n/a) Reason for Treatment:  (n/a) Prior Outpatient Therapy: Prior Outpatient Therapy: No Prior Therapy Dates:  (n/a) Prior Therapy Facilty/Provider(s):  (n/a) Reason for Treatment:  (n/a) Does patient have an ACCT team?: No Does patient have Intensive In-House Services?  : No Does patient have [facility] services? : No Does patient have P4CC services?: No  Past Medical History:  Past Medical History:  Diagnosis Date  . HSP (Henoch Schonlein purpura) ([place]) 05/99   hs no renal involvement hosp with abd pain [place] rx  with prednisone  . Pneumonia 05/04   RML   History reviewed. No pertinent surgical history. Family History:  Family History  Problem Relation Age of Onset  . Celiac disease Sister   . Thyroid disease Sister     autoimmune disease  . Diabetes type II    . Hypertension    . Hypertension Maternal Grandmother    Family Psychiatric  History: none Social History:  History  Alcohol Use No    Comment: rarely     History  Drug Use    Comment: Marijuana    Social History   Social History  . Marital status: Single    Spouse name: N/A  . Number of children: N/A  . Years of  education: N/A   Social History Main Topics  . Smoking status: Never Smoker  . Smokeless tobacco: Never Used  . Alcohol use No     Comment: rarely  . Drug use:      Comment: Marijuana  . Sexual activity: Not Asked   Other Topics Concern  . 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 Lactose Intolerance (Gi) Diarrhea    Labs:  Results for orders placed or performed during the hospital encounter of 03/03/16 (from the past 48 hour(s))  CBC with Differential     Status: Abnormal   Collection Time: 03/03/16  9:35 AM  Result Value Ref Range   WBC 12.7 (H) 4.0 - 10.5 K/uL   RBC 4.04 3.87 - 5.11 MIL/uL   Hemoglobin 12.9 12.0 - 15.0 g/dL   HCT 38.0 36.0 - 46.0 %   MCV 94.1 78.0 - 100.0 fL   MCH 31.9 26.0 - 34.0 pg   MCHC 33.9 30.0 - 36.0 g/dL   RDW 12.6 11.5 - 15.5 %   Platelets 258 150 - 400 K/uL   Neutrophils Relative % 79 %   Neutro Abs 10.0 (H) 1.7 - 7.7 K/uL   Lymphocytes Relative 12 %   Lymphs Abs 1.5 0.7 - 4.0 K/uL   Monocytes Relative 9 %   Monocytes Absolute 1.1 (H) 0.1 - 1.0 K/uL   Eosinophils Relative 0 %   Eosinophils Absolute 0.0 0.0 - 0.7 K/uL   Basophils Relative 0 %   Basophils Absolute 0.0 0.0 - 0.1 K/uL  Acetaminophen level     Status: Abnormal   Collection Time: 03/03/16  9:35 AM  Result Value Ref Range   Acetaminophen (Tylenol), Serum <10 (L) 10 - 30 ug/mL    Comment:        THERAPEUTIC CONCENTRATIONS VARY SIGNIFICANTLY. A RANGE OF 10-30 ug/mL MAY BE AN EFFECTIVE CONCENTRATION FOR MANY PATIENTS. HOWEVER, SOME ARE BEST TREATED AT CONCENTRATIONS OUTSIDE THIS RANGE. ACETAMINOPHEN CONCENTRATIONS >150 ug/mL AT 4 HOURS AFTER INGESTION AND >50 ug/mL AT 12 HOURS AFTER INGESTION ARE OFTEN ASSOCIATED WITH TOXIC REACTIONS.   Ethanol     Status: None   Collection Time: 03/03/16  9:35 AM  Result Value Ref Range   Alcohol, Ethyl (B) <5 <5 mg/dL    Comment:        LOWEST DETECTABLE LIMIT FOR SERUM ALCOHOL IS 5 mg/dL FOR MEDICAL PURPOSES ONLY   Salicylate level     Status: None   Collection Time: 03/03/16  9:35 AM  Result Value  Ref Range   Salicylate Lvl <3.8 2.8 - 30.0 mg/dL  Comprehensive metabolic panel     Status: Abnormal   Collection Time: 03/03/16  9:35 AM  Result Value Ref Range   Sodium 135 135 - 145 mmol/L   Potassium 3.4 (L) 3.5 - 5.1 mmol/L   Chloride 104 101 - 111 mmol/L   CO2 23 22 - 32 mmol/L   Glucose, Bld 112 (H) 65 - 99 mg/dL   BUN 8 6 - 20 mg/dL   Creatinine, Ser 0.58 0.44 -  1.00 mg/dL   Calcium 8.8 (L) 8.9 - 10.3 mg/dL   Total Protein 7.3 6.5 - 8.1 g/dL   Albumin 4.4 3.5 - 5.0 g/dL   AST 17 15 - 41 U/L   ALT 15 14 - 54 U/L   Alkaline Phosphatase 60 38 - 126 U/L   Total Bilirubin 0.7 0.3 - 1.2 mg/dL   GFR calc non Af Amer >60 >60 mL/min   GFR calc Af Amer >60 >60 mL/min    Comment: (NOTE) The eGFR has been calculated using the CKD EPI equation. This calculation has not been validated in all clinical situations. eGFR's persistently <60 mL/min signify possible Chronic Kidney Disease.    Anion gap 8 5 - 15  Pregnancy, urine     Status: None   Collection Time: 03/03/16 11:53 AM  Result Value Ref Range   Preg Test, Ur NEGATIVE NEGATIVE    Comment:        THE SENSITIVITY OF THIS METHODOLOGY IS >20 mIU/mL.   Urine rapid drug screen (hosp performed)     Status: Abnormal   Collection Time: 03/03/16 11:53 AM  Result Value Ref Range   Opiates NONE DETECTED NONE DETECTED   Cocaine NONE DETECTED NONE DETECTED   Benzodiazepines POSITIVE (A) NONE DETECTED   Amphetamines NONE DETECTED NONE DETECTED   Tetrahydrocannabinol POSITIVE (A) NONE DETECTED   Barbiturates NONE DETECTED NONE DETECTED    Comment:        DRUG SCREEN FOR MEDICAL PURPOSES ONLY.  IF CONFIRMATION IS NEEDED FOR ANY PURPOSE, NOTIFY LAB WITHIN 5 DAYS.        LOWEST DETECTABLE LIMITS FOR URINE DRUG SCREEN Drug Class       Cutoff (ng/mL) Amphetamine      1000 Barbiturate      200 Benzodiazepine   270 Tricyclics       623 Opiates          300 Cocaine          300 THC              50     Current  Facility-Administered Medications  Medication Dose Route Frequency Provider Last Rate Last Dose  . OLANZapine zydis (ZYPREXA) disintegrating tablet 10 mg  10 mg Oral BID Patrecia Pour, NP   10 mg at 03/04/16 7628   Current Outpatient Prescriptions  Medication Sig Dispense Refill  . Caffeine-Magnesium Salicylate (DIUREX) 31-517.6 MG TABS Take by mouth.    . Homeopathic Products (ALLERGY MEDICINE PO) Take 1 tablet by mouth every morning.    . Multiple Vitamin (MULTIVITAMIN WITH MINERALS) TABS tablet Take 1 tablet by mouth daily.      Musculoskeletal: Strength & Muscle Tone: within normal limits Gait & Station: normal Patient leans: N/A  Psychiatric Specialty Exam: Physical Exam  Constitutional: She is oriented to person, place, and time. She appears well-developed and well-nourished.  HENT:  Head: Normocephalic.  Neck: Normal range of motion.  Respiratory: Effort normal.  Musculoskeletal: Normal range of motion.  Neurological: She is alert and oriented to person, place, and time.  Skin: Skin is warm and dry.  Psychiatric: Thought content normal. Her mood appears anxious. Her affect is labile. Her speech is rapid and/or pressured and tangential. She expresses impulsivity. She exhibits abnormal recent memory. She is inattentive.    Review of Systems  Constitutional: Negative.   HENT: Negative.   Eyes: Negative.   Respiratory: Negative.   Cardiovascular: Negative.   Gastrointestinal: Negative.   Genitourinary: Negative.  Musculoskeletal: Negative.   Skin: Negative.   Neurological: Negative.   Endo/Heme/Allergies: Negative.   Psychiatric/Behavioral: The patient is nervous/anxious.     Blood pressure 115/71, pulse 111, temperature 98.2 F (36.8 C), temperature source Oral, resp. rate 20, SpO2 100 %.There is no height or weight on file to calculate BMI.  General Appearance: Casual  Eye Contact:  Fair  Speech:  Pressured  Volume:  Normal  Mood:  Anxious  Affect:  Labile   Thought Process:  Descriptions of Associations: Tangential  Orientation:  Full (Time, Place, and Person)  Thought Content:  Tangential  Suicidal Thoughts:  No  Homicidal Thoughts:  No  Memory:  Immediate;   Good Recent;   Poor Remote;   Fair  Judgement:  Impaired  Insight:  Fair  Psychomotor Activity:  Increased  Concentration:  Concentration: Poor and Attention Span: Poor  Recall:  [place] of Knowledge:  Good  Language:  Good  Akathisia:  No  Handed:  Right  AIMS (if indicated):     Assets:  Housing Leisure Time Physical Health Resilience Social Support  ADL's:  Intact  Cognition:  WNL  Sleep:        Treatment Plan Summary: Daily contact with patient to assess and evaluate symptoms and progress in treatment, Medication management and Plan bipolar affective disorder, mania, severe without psychosis:  -Crisis stabilization -Medication management:  Start Zyprexa 10 mg BID for bipolar disorder, PRN medications for agitated x 2 :  Geodon 10 mg IM and Benadryl 50 mg IM, Zyprexa 10 mg IM and Benadryl 50 mg IM -Individual counseling  Disposition: Recommend psychiatric Inpatient admission when medically cleared.  Waylan Boga, NP 03/04/2016 10:58 AM

## 2016-03-04 NOTE — Progress Notes (Signed)
Pt invited, but declined to attend wrap-up group this evening.  

## 2016-03-05 ENCOUNTER — Encounter (HOSPITAL_COMMUNITY): Payer: Self-pay | Admitting: Psychiatry

## 2016-03-05 DIAGNOSIS — F3164 Bipolar disorder, current episode mixed, severe, with psychotic features: Secondary | ICD-10-CM | POA: Clinically undetermined

## 2016-03-05 DIAGNOSIS — F1210 Cannabis abuse, uncomplicated: Secondary | ICD-10-CM | POA: Clinically undetermined

## 2016-03-05 MED ORDER — QUETIAPINE FUMARATE 25 MG PO TABS
25.0000 mg | ORAL_TABLET | Freq: Every day | ORAL | Status: DC
  Administered 2016-03-05: 25 mg via ORAL
  Filled 2016-03-05 (×2): qty 1

## 2016-03-05 MED ORDER — LORAZEPAM 2 MG/ML IJ SOLN: 1.0000 mg | Freq: Four times a day (QID) | INTRAMUSCULAR | Status: DC | PRN

## 2016-03-05 MED ORDER — LORAZEPAM 1 MG PO TABS: 1.0000 mg | ORAL_TABLET | Freq: Four times a day (QID) | ORAL | Status: DC | PRN

## 2016-03-05 MED ORDER — LAMOTRIGINE 25 MG PO TABS
25.0000 mg | ORAL_TABLET | Freq: Every day | ORAL | Status: DC
Start: 2016-03-05 — End: 2016-03-09
  Administered 2016-03-05 – 2016-03-08 (×4): 25 mg via ORAL
  Filled 2016-03-05 (×7): qty 1
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
## 2016-03-05 NOTE — Progress Notes (Signed)
Recreation Therapy Notes  INPATIENT RECREATION THERAPY ASSESSMENT  Patient Details Name: Laurie Stevens MRN: [id number] DOB: 06/22/1991 Today's Date: 03/05/2016  Patient Stressors: Family, Relationship, Work  Pt stated she was here because she was "acting crazy, tried to get into someone's house".  Coping Skills:   Isolate, Substance Abuse, Avoidance, Exercise, Talking, Music, Sports, Other (Comment)  Pt stated she used to smoke weed. Pt stated she likes to swim. Pt stated she likes to watch movies and play video games.  Personal Challenges: Anger, Communication, Concentration, Decision-Making, Expressing Yourself, Relationships, Social Interaction, Trusting Others  Leisure Interests (2+):  Nature - Other (Comment), Individual - Other (Comment) (Being outside (beach), Netflix)  Awareness of Community Resources:  Yes  Community Resources:  Park, Research scientist (physical sciences)Movie Theaters, Other (Comment) Building surveyor(Pool)  Current Use: Yes    Patient Strengths:  Engineer, waterGood artist, can connect to people well  Patient Identified Areas of Improvement:  Confidence, seeing other people's point of view  Current Recreation Participation:  Everyday  Patient Goal for Hospitalization:  "I don't know"  [place]  [place]  Current [place] (including self-harm):  No  Current HI:  No  Consent to Intern Participation: N/A   Caroll RancherMarjette Jannice Beitzel, LRT/CTRS  Caroll RancherLindsay, Corvette Orser A 03/05/2016, 3:25 PM

## 2016-03-05 NOTE — Progress Notes (Signed)
Patient is calm and cooperative this am. Patient is extremely bubbly and talking in soft, high voice. Patient denies any depression, anxiety, and hopelessness. Patient denies SI, Hi, and AVH.   Patient remains safe through q 15 min checks, patient offered education, support, and encouragement. Medications administered.   Patient is receptive and cooperative, will continue to monitor.
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
## 2016-03-05 NOTE — BHH Suicide Risk Assessment (Signed)
[facility] Admission Suicide Risk Assessment   Nursing information obtained from:  Patient Demographic factors:  Adolescent or young adult, Caucasian, Unemployed Current Mental Status:  NA Loss Factors:  Loss of significant relationship, Financial problems / change in socioeconomic status Historical Factors:  Impulsivity Risk Reduction Factors:  Living with another person, especially a relative  Total Time spent with patient: 30 minutes Principal Problem: Bipolar disorder, curr episode mixed, severe, with psychotic features ([place]) Diagnosis:   Patient Active Problem List   Diagnosis Date Noted  . Bipolar disorder, curr episode mixed, severe, with psychotic features ([place]) [F31.64] 03/05/2016  . Cannabis use disorder, mild, abuse [F12.10] 03/05/2016  . Abdominal bloating [R14.0] 12/02/2010  . Weight gain [R63.5] 12/02/2010  . Family history of celiac disease [Z83.79] 12/02/2010  . EDEMA [R60.9] 01/17/2008  . CHEST PAIN [R07.9] 01/17/2008  . 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   Continued Clinical Symptoms:  Alcohol Use Disorder Identification Test Final Score (AUDIT): 2 The "Alcohol Use Disorders Identification Test", Guidelines for Use in Primary Care, Second Edition.  World Pharmacologist [facility]). Score between 0-7:  no or low risk or alcohol related problems. Score between 8-15:  moderate risk of alcohol related problems. Score between 16-19:  high risk of alcohol related problems. Score 20 or above:  warrants further diagnostic evaluation for alcohol dependence and treatment.   CLINICAL FACTORS:   Bipolar Disorder:   Mixed State Alcohol/Substance Abuse/Dependencies    Psychiatric Specialty Exam: Physical Exam  Nursing note and vitals reviewed.   Review of Systems  Psychiatric/Behavioral: Positive for depression and substance abuse. The patient is nervous/anxious.   All other systems reviewed and are negative.   Blood pressure  115/80, pulse 91, temperature 97.8 F (36.6 C), temperature source Oral, resp. rate 16, height 5' 6"  (1.676 m), weight 64.9 kg (143 lb), SpO2 100 %.Body mass index is 23.08 kg/m.   Please see H&P for mse.     COGNITIVE FEATURES THAT CONTRIBUTE TO RISK:  Closed-mindedness, Polarized thinking and Thought constriction (tunnel vision)    SUICIDE RISK:   Mild:  Suicidal ideation of limited frequency, intensity, duration, and specificity.  There are no identifiable plans, no associated intent, mild dysphoria and related symptoms, good self-control (both objective and subjective assessment), few other risk factors, and identifiable protective factors, including available and accessible social support.   PLAN OF CARE: Please see H&P.   I certify that inpatient services furnished can reasonably be expected to improve the patient's condition.  Rigel Filsinger, MD 03/05/2016, 1:00 PM

## 2016-03-05 NOTE — Progress Notes (Signed)
Father called at 4 pm and would like for her to return call to [phone]. He stated that he has called approximately ten times and has not spoken with her and he is concerned. Will notify her once she is back up from recreation time.

## 2016-03-05 NOTE — Progress Notes (Signed)
  D: Writer attempted to wake pt in order to do an assessment. Called pt name several times. However, pt laying in bed resting with eyes closed. Respirations even and unlabored. No distress noted. A: Monitor every 15 minutes for safety. R: Pt remains safe.

## 2016-03-05 NOTE — Progress Notes (Signed)
Recreation Therapy Notes  Date: 03/05/16 Time: 1000 Location: 500 Hall Dayroom  Group Topic: Communication, Team Building, Problem Solving  Goal Area(s) Addresses:  Patient will effectively work with peer towards shared goal.  Patient will identify skill used to make activity successful.  Patient will identify how skills used during activity can be used to reach post d/c goals.   Behavioral Response: Engaged  Intervention: STEM Activity   Activity: Glass blower/designeripe Cleaner Tower. In teams, patients were asked to build the tallest freestanding tower possible out of 15 pipe cleaners. Systematically resources were removed, for example patient ability to use both hands and patient ability to verbally communicate.    Education: Pharmacist, communityocial Skills, Building control surveyorDischarge Planning.   Education Outcome: Acknowledges education/In group clarification offered/Needs additional education.   Clinical Observations/Feedback: Pt worked well with her peers and helped them come up with the concept for their tower.  Pt left early with doctor and did not return.  Caroll RancherMarjette Doraine Schexnider, LRT/CTRS    Caroll RancherLindsay, Dolorez Jeffrey A 03/05/2016 12:39 PM

## 2016-03-05 NOTE — BHH Suicide Risk Assessment (Signed)
BHH INPATIENT:  Family/Significant Other Suicide Prevention Education  Suicide Prevention Education:  Education Completed; Donalynn FurlongLinda Jones, mother, [phone]  has been identified by the patient as the family member/significant other with whom the patient will be residing, and identified as the person(s) who will aid the patient in the event of a mental health crisis (suicidal ideations/suicide attempt).  With written consent from the patient, the family member/significant other has been provided the following suicide prevention education, prior to the and/or following the discharge of the patient.  The suicide prevention education provided includes the following:  Suicide risk factors  Suicide prevention and interventions  National Suicide Hotline telephone number  [facility] assessment telephone number  [facility] Emergency Assistance 911  [facility] and/or Residential Mobile Crisis Unit telephone number  Request made of family/significant other to:  Remove weapons (e.g., guns, rifles, knives), all items previously/currently identified as safety concern.    Remove drugs/medications (over-the-counter, prescriptions, illicit drugs), all items previously/currently identified as a safety concern.  The family member/significant other verbalizes understanding of the suicide prevention education information provided.  The family member/significant other agrees to remove the items of safety concern listed above. Mother agreed to remove guns from home and her vehicle in anticipation of daughter's return home.  Ida RogueRodney B Ashton Belote 03/05/2016, 12:01 PM

## 2016-03-05 NOTE — Progress Notes (Signed)
NUTRITION ASSESSMENT  Pt identified as at risk on the Malnutrition Screen Tool  INTERVENTION: 1. Pt follows a vegan diet PTA, will adjust diet to vegetarian in EPIC. 2. Supplements: Continue Ensure Enlive po BID, each supplement provides 350 kcal and 20 grams of protein  NUTRITION DIAGNOSIS: Unintentional weight loss related to sub-optimal intake as evidenced by pt report.   Goal: Pt to meet >/= 90% of their estimated nutrition needs.  Monitor:  PO intake  Assessment:  Pt admitted with bipolar disorder, not currently on medications. Per chart review, pt follows a vegan diet. RD will adjust diet to vegetarian. Pt has lost 12 lb since 7/7 (8% wt loss x >1 month, significant for time frame). Pt has been ordered Ensure supplements.   Height: Ht Readings from Last 1 Encounters:  03/04/16 5\' 6"  (1.676 m)    Weight: Wt Readings from Last 1 Encounters:  03/04/16 143 lb (64.9 kg)    Weight Hx: Wt Readings from Last 10 Encounters:  03/04/16 143 lb (64.9 kg)  01/24/16 155 lb 3.2 oz (70.4 kg)  12/02/10 180 lb (81.6 kg)  01/17/08 206 lb (93.4 kg) (98 %, Z= 2.06)*   * Growth percentiles are based on CDC 2-20 Years data.    BMI:  Body mass index is 23.08 kg/m. Pt meets criteria for normal range based on current BMI.  Estimated Nutritional Needs: Kcal: 25-30 kcal/kg Protein: > 1 gram protein/kg Fluid: 1 ml/kcal  Diet Order: Diet regular Room service appropriate? Yes; Fluid consistency: Thin Pt is also offered choice of unit snacks mid-morning and mid-afternoon.  Pt is eating as desired.   Lab results and medications reviewed.   Tilda FrancoLindsey Patricia Fargo, MS, RD, LDN Pager: [phone] After Hours Pager: [phone]
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
## 2016-03-05 NOTE — Progress Notes (Signed)
Adult Psychoeducational Group Note  Date:  03/05/2016 Time:  8:55 PM  Group Topic/Focus:  Wrap-Up Group:   The focus of this group is to help patients review their daily goal of treatment and discuss progress on daily workbooks.   Participation Level:  Active  Participation Quality:  Appropriate  Affect:  Appropriate  Cognitive:  Appropriate  Insight: Appropriate  Engagement in Group:  Engaged  Modes of Intervention:  Discussion  Additional Comments: The patient expressed that she rates today a 10.The patient also said that she attended all groups. Octavio Mannshigpen, Oumou Smead Lee 03/05/2016, 8:55 PM

## 2016-03-05 NOTE — Tx Team (Signed)
Interdisciplinary Treatment Plan Update (Adult)  Date:  03/05/2016   Time Reviewed:  9:08 AM   Progress in Treatment: Attending groups: Yes. Participating in groups:  Yes. Taking medication as prescribed:  Yes. Tolerating medication:  Yes. Family/Significant other contact made:  Yes Patient understands diagnosis:  Yes  As evidenced by seeking help with "confusion about what is real" Discussing patient identified problems/goals with staff:  Yes, see initial care plan. Medical problems stabilized or resolved:  Yes. Denies suicidal/homicidal ideation: Yes. Issues/concerns per patient self-inventory:  No. Other:  New problem(s) identified:  Discharge Plan or Barriers: see below  Reason for Continuation of Hospitalization: Delusions  Depression Medication stabilization Other; describe mood swings, disorganization  Comments:  Officer at bedside reports pt ran out of the house early this am and her family couldn't find her.  About a week ago, She and  boyfriend broke up and she has been having problems since.  She was found behind their house hiding in some bushes. When they found her, she started to run out into the streets, laid in the grass and became combative and aggressive.  Pt was handcuffed during assessment.  Pt states "I am living in a fake world.  I just need love, everyone hates eachother."  Pt appears disheveled.  Will start a trial of Lamictal 25 mg po daily for mood sx. Will start Seroquel 25 mg po qhs for augmenting Lamictal/psychosis/sleep. Will DC Zyprexa . Will make available PRN medications as per agitation protocol.  Estimated length of stay: 3-5 days  New goal(s):  Review of initial/current patient goals per problem list:   Review of initial/current patient goals per problem list:  1. Goal(s): Patient will participate in aftercare plan   Met: Yes   Target date: 3-5 days post admission date   As evidenced by: Patient will participate within aftercare  plan AEB aftercare provider and housing plan at discharge being identified. 03/05/16:  Return home, follow up outpt   2. Goal (s): Patient will exhibit decreased depressive symptoms and suicidal ideations.   Met: No   Target date: 3-5 days post admission date   As evidenced by: Patient will utilize self rating of depression at 3 or below and demonstrate decreased signs of depression or be deemed stable for discharge by MD. 03/05/16:  Rates depression a 4 today   5. Goal(s): Patient will demonstrate decreased signs of psychosis  * Met: No  * Target date: 3-5 days post admission date  * As evidenced by: Patient will demonstrate decreased frequency of AVH or return to baseline function 03/05/16:  C/O inability to discern what is real and what is not    6. Goal (s): Patient will demonstrate decreased signs of mania  * Met: No * Target date: 3-5 days post admission date  * As evidenced by: Patient demonstrate decreased signs of mania AEB decreased mood instability and return to baseline functioning 03/05/16:  Pt endorses mood swings, willing to try Lamictal trial     Attendees: Patient:  03/05/2016 9:08 AM   Family:   03/05/2016 9:08 AM   Physician:  Ursula Alert, MD 03/05/2016 9:08 AM   Nursing:   Caryl Asp, RN 03/05/2016 9:08 AM   CSW:    Roque Lias, LCSW   03/05/2016 9:08 AM   Other:  03/05/2016 9:08 AM   Other:   03/05/2016 9:08 AM   Other:  Lars Pinks, Nurse CM 03/05/2016 9:08 AM   Other:   03/05/2016 9:08 AM   Other:  Norberto Sorenson, [facility]  03/05/2016 9:08 AM   Other:  03/05/2016 9:08 AM   Other:  03/05/2016 9:08 AM   Other:  03/05/2016 9:08 AM   Other:  03/05/2016 9:08 AM   Other:  03/05/2016 9:08 AM   Other:   03/05/2016 9:08 AM    Scribe for Treatment Team:   Trish Mage, 03/05/2016 9:08 AM

## 2016-03-06 LAB — LIPID PANEL
CHOL/HDL RATIO: 3.1 ratio
CHOLESTEROL: 130 mg/dL (ref 0–200)
HDL: 42 mg/dL (ref >40)
LDL Cholesterol: 75 mg/dL (ref 0–99)
TRIGLYCERIDES: 64 mg/dL (ref <150)
VLDL: 13 mg/dL (ref 0–40)

## 2016-03-06 LAB — BASIC METABOLIC PANEL
Anion gap: 7 (ref 5–15)
BUN: 7 mg/dL (ref 6–20)
CO2: 27 mmol/L (ref 22–32)
CREATININE: 0.75 mg/dL (ref 0.44–1.00)
Calcium: 9.3 mg/dL (ref 8.9–10.3)
Chloride: 105 mmol/L (ref 101–111)
GFR calc Af Amer: >60 mL/min (ref >60)
Glucose, Bld: 95 mg/dL (ref 65–99)
Potassium: 3.4 mmol/L — ABNORMAL LOW (ref 3.5–5.1)
SODIUM: 139 mmol/L (ref 135–145)

## 2016-03-06 LAB — TSH: TSH: 2.346 u[IU]/mL (ref 0.350–4.500)

## 2016-03-06 MED ORDER — QUETIAPINE FUMARATE 50 MG PO TABS
50.0000 mg | ORAL_TABLET | Freq: Every day | ORAL | Status: DC
  Administered 2016-03-06 – 2016-03-08 (×3): 50 mg via ORAL
  Filled 2016-03-06 (×5): qty 1

## 2016-03-06 MED ORDER — POTASSIUM CHLORIDE CRYS ER 10 MEQ PO TBCR: 10.0000 meq | EXTENDED_RELEASE_TABLET | Freq: Two times a day (BID) | ORAL | Status: DC

## 2016-03-06 MED ORDER — POTASSIUM CHLORIDE CRYS ER 10 MEQ PO TBCR
10.0000 meq | EXTENDED_RELEASE_TABLET | Freq: Two times a day (BID) | ORAL | Status: AC
  Administered 2016-03-06 – 2016-03-07 (×2): 10 meq via ORAL
  Filled 2016-03-06 (×3): qty 1

## 2016-03-06 NOTE — Progress Notes (Addendum)
Recreation Therapy Notes  Date: 03/06/16 Time: 1000 Location: 500 Hall Dayroom  Group Topic: Stress Management  Goal Area(s) Addresses:  Patient will verbalize importance of using healthy stress management.  Patient will identify positive emotions associated with healthy stress management.   Behavioral Response: Engaged  Intervention: Stress Management  Activity :  Deep Breathing, [employer] Guided Imagery.  LRT introduced patients to the techniques of deep breathing and guided imagery.  Patients were to follow along as LRT read scripts to participate in stress management techniques.     Education:  Stress Management, Discharge Planning.   Education Outcome: Acknowledges edcuation/In group clarification offered/Needs additional education  Clinical Observations/Feedback:  Pt stated stress makes her feel anxious.  Pt expressed that during the deep breathing it was hard to hold her breath because it brought on an anxious feeling but once she exhaled it was better.  During the guided imagery, pt stated she was scared of the birds, so "I had to tell myself the birds were okay and the visual of the deer and the ducks gave comfort".  Pt also stated her mind was racing and she had to tell herself to concentrate.    Caroll RancherMarjette Arissa Stevens, LRT/CTRS   Caroll RancherLindsay, Torion Hulgan A 03/06/2016 12:30 PM

## 2016-03-06 NOTE — Progress Notes (Signed)
[facility] MD Progress Note  03/06/2016 1:02 PM Laurie Stevens  MRN:  [id number] Subjective: Patient states " I thought I was in a fake world , everything was perfect and excellent and I was elated. Now I am coming back to the real world , I need to be more positive , but it was positive to begin with.'  Objective:Laurie Stevens a 25 y.o.caucasian female who is single , unemployed , lives with her mother in [place] , has a hx of depression ( not officially diagnosed) , who presented to  to [facility] with psychosis as well as bizarre behavior - hiding behind bushes early AM - running out in to the street and being combative.  Patient seen and chart reviewed.Discussed patient with treatment team.  Pt continues to be euphoric , with concrete thought process and is seen as irrelevant often during evaluation . Pt has been taking her medications , denies ADRs. Per staff - pt continues to need support , has been observed as attending groups.      Principal Problem: Bipolar disorder, curr episode mixed, severe, with psychotic features ([place]) Diagnosis:   Patient Active Problem List   Diagnosis Date Noted  . Bipolar disorder, curr episode mixed, severe, with psychotic features ([place]) [F31.64] 03/05/2016  . Cannabis use disorder, mild, abuse [F12.10] 03/05/2016  . Abdominal bloating [R14.0] 12/02/2010  . Weight gain [R63.5] 12/02/2010  . Family history of celiac disease [Z83.79] 12/02/2010  . EDEMA [R60.9] 01/17/2008  . CHEST PAIN [R07.9] 01/17/2008  . 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   Past Medical History:  Past Medical History:  Diagnosis Date  . HSP (Henoch Schonlein purpura) ([place]) 05/99   hs no renal involvement hosp with abd pain [place] rx  with prednisone  . Pneumonia 05/04   RML   History reviewed. No pertinent surgical history. Family History:  Family History  Problem Relation Age of Onset   . Celiac disease Sister   . Thyroid disease Sister   . Thyroid disease Sister     autoimmune disease  . Diabetes type II    . Hypertension    . Hypertension Maternal Grandmother   . Cancer Maternal Grandmother   . Cancer - Cervical Maternal Grandmother   . Suicidality Maternal Grandfather   . Alcoholism Maternal Uncle    Family Psychiatric  History: Please see H&P.  Social History: Please see H&P.  History  Alcohol Use No    Comment: rarely     History  Drug Use    Comment: Marijuana    Social History   Social History  . Marital status: Single    Spouse name: N/A  . Number of children: N/A  . Years of education: N/A   Social History Main Topics  . Smoking status: Never Smoker  . Smokeless tobacco: Never Used  . Alcohol use No     Comment: rarely  . Drug use:      Comment: Marijuana  . Sexual activity: Not Currently   Other Topics Concern  . 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    Works for [employer] over in [place] to move to either [place] or [place] to keep this kind of job  Additional Social History:    Pain Medications: SEE MAR Prescriptions: SEE MAR Over the Counter: SEE MAR History of alcohol / drug use?: Yes Name of Substance 1: Cocaine  1 - Age of First Use: unk 1 - Amount (size/oz): unk 1 - Frequency: unk 1 - Duration: 1 year 1 - Last Use / Amount: "last year" Name of Substance 2: THC 2 - Age of First Use: 18 2 - Amount (size/oz): unk 2 - Frequency: unk 2 - Duration: unk 2 - Last Use / Amount: "I don't remember" Name of Substance 3: Alcohol  3 - Age of First Use: unk 3 - Amount (size/oz): "A shot" 3 - Frequency: social drinker 3 - Duration: on-going  3 - Last Use / Amount: March 2017              Sleep: improved  Appetite:  Fair  Current Medications: Current Facility-Administered Medications   Medication Dose Route Frequency Provider Last Rate Last Dose  . acetaminophen (TYLENOL) tablet 650 mg  650 mg Oral Q6H PRN Patrecia Pour, NP      . alum & mag hydroxide-simeth (MAALOX/MYLANTA) 200-200-20 MG/5ML suspension 30 mL  30 mL Oral Q4H PRN Patrecia Pour, NP      . feeding supplement (ENSURE ENLIVE) (ENSURE ENLIVE) liquid 237 mL  237 mL Oral BID BM Kamaiyah Uselton, MD   237 mL at 03/06/16 1037  . lamoTRIgine (LAMICTAL) tablet 25 mg  25 mg Oral Q2000 Ursula Alert, MD   25 mg at 03/05/16 2005  . loratadine (CLARITIN) tablet 10 mg  10 mg Oral Daily Kerrie Buffalo, NP   10 mg at 03/06/16 0846  . LORazepam (ATIVAN) tablet 1 mg  1 mg Oral Q6H PRN Ursula Alert, MD       Or  . LORazepam (ATIVAN) injection 1 mg  1 mg Intramuscular Q6H PRN Evalene Vath, MD      . magnesium hydroxide (MILK OF MAGNESIA) suspension 30 mL  30 mL Oral Daily PRN Patrecia Pour, NP      . multivitamin with minerals tablet 1 tablet  1 tablet Oral Daily Kerrie Buffalo, NP   1 tablet at 03/06/16 0846  . QUEtiapine (SEROQUEL) tablet 50 mg  50 mg Oral QHS Ursula Alert, MD        Lab Results:  Results for orders placed or performed during the hospital encounter of 03/04/16 (from the past 48 hour(s))  TSH     Status: None   Collection Time: 03/06/16  6:24 AM  Result Value Ref Range   TSH 2.346 0.350 - 4.500 uIU/mL    Comment: Performed at [facility]  Lipid panel     Status: None   Collection Time: 03/06/16  6:24 AM  Result Value Ref Range   Cholesterol 130 0 - 200 mg/dL   Triglycerides 64 <150 mg/dL   HDL 42 >40 mg/dL   Total CHOL/HDL Ratio 3.1 RATIO   VLDL 13 0 - 40 mg/dL   LDL Cholesterol 75 0 - 99 mg/dL    Comment:        Total Cholesterol/HDL:CHD Risk Coronary Heart Disease Risk Table                     Men   Women  1/2 Average Risk   3.4   3.3  Average Risk       5.0   4.4  2 X Average Risk   9.6   7.1  3 X Average Risk  23.4   11.0        Use the calculated Patient  Ratio above and the CHD Risk Table to determine the patient's CHD Risk.        ATP III CLASSIFICATION (LDL):  <100     mg/dL   Optimal  100-129  mg/dL   Near or Above                    Optimal  130-159  mg/dL   Borderline  160-189  mg/dL   High  >190     mg/dL   Very High Performed at [facility] metabolic panel     Status: Abnormal   Collection Time: 03/06/16  6:24 AM  Result Value Ref Range   Sodium 139 135 - 145 mmol/L   Potassium 3.4 (L) 3.5 - 5.1 mmol/L   Chloride 105 101 - 111 mmol/L   CO2 27 22 - 32 mmol/L   Glucose, Bld 95 65 - 99 mg/dL   BUN 7 6 - 20 mg/dL   Creatinine, Ser 0.75 0.44 - 1.00 mg/dL   Calcium 9.3 8.9 - 10.3 mg/dL   GFR calc non Af Amer >60 >60 mL/min   GFR calc Af Amer >60 >60 mL/min    Comment: (NOTE) The eGFR has been calculated using the CKD EPI equation. This calculation has not been validated in all clinical situations. eGFR's persistently <60 mL/min signify possible Chronic Kidney Disease.    Anion gap 7 5 - 15    Comment: Performed at [facility]    Blood Alcohol level:  Lab Results  Component Value Date   [facility] <5 50/53/9767    Metabolic Disorder Labs: No results found for: HGBA1C, MPG No results found for: PROLACTIN Lab Results  Component Value Date   CHOL 130 03/06/2016   TRIG 64 03/06/2016   HDL 42 03/06/2016   CHOLHDL 3.1 03/06/2016   VLDL 13 03/06/2016   LDLCALC 75 03/06/2016   LDLCALC 87 12/02/2010    Physical Findings: AIMS: Facial and Oral Movements Muscles of Facial Expression: None, normal Lips and Perioral Area: None, normal Jaw: None, normal Tongue: None, normal,Extremity Movements Upper (arms, wrists, hands, fingers): None, normal Lower (legs, knees, ankles, toes): None, normal, Trunk Movements Neck, shoulders, hips: None, normal, Overall Severity Severity of abnormal movements (highest score from questions above): None, normal Incapacitation due to abnormal movements: None,  normal Patient's awareness of abnormal movements (rate only patient's report): No Awareness, Dental Status Current problems with teeth and/or dentures?: No Does patient usually wear dentures?: No  CIWA:  CIWA-Ar Total: 1 COWS:  COWS Total Score: 2  Musculoskeletal: Strength & Muscle Tone: within normal limits Gait & Station: normal Patient leans: N/A  Psychiatric Specialty Exam: Physical Exam  Nursing note and vitals reviewed.   Review of Systems  Psychiatric/Behavioral: Positive for hallucinations. The patient is nervous/anxious.   All other systems reviewed and are negative.   Blood pressure 115/80, pulse 91, temperature 97.8 F (36.6 C), temperature source Oral, resp. rate 16, height _0  (1.676 m), weight 64.9 kg (143 lb), SpO2 100 %.Body mass index is 23.08 kg/m.  General Appearance: Fairly Groomed  Eye Contact:  Fair  Speech:  Normal Rate  Volume:  Decreased  Mood:  Anxious and Euphoric  Affect:  Congruent  Thought Process:  Irrelevant and Descriptions of Associations: Tangential  Orientation:  Full (Time, Place, and Person)  Thought Content:  Delusions, Paranoid Ideation, Rumination and Tangential  Suicidal Thoughts:  No paranoid , irrelevant- potential danger to self or others  Homicidal Thoughts:  No  Memory:  Immediate;   Fair Recent;   Poor Remote;   Poor  Judgement:  Impaired  Insight:  Shallow  Psychomotor Activity:  Restlessness  Concentration:  Concentration: Fair and Attention Span: Fair  Recall:  [employer] of Knowledge:  Fair  Language:  Fair  Akathisia:  No  Handed:  Right  AIMS (if indicated):   0  Assets:  Desire for Improvement Social Support Talents/Skills  ADL's:  Intact  Cognition:  WNL  Sleep:  Number of Hours: 4.5     Treatment Plan Summary:Toluwanimi D Stevens a 25 y.o.caucasian female who is single , unemployed , lives with her mother in [place] , has a hx of depression ( not officially diagnosed) , who presented to  to [facility] with  psychosis as well as bizarre behavior - hiding behind bushes early AM - running out in to the street and being combative.  Patient continues to present as irrelevant and delusional - will continue treatment. Daily contact with patient to assess and evaluate symptoms and progress in treatment and Medication management Will continue Lamictal 25 mg po daily for mood sx. Will increase Seroquel to 50 mg po qhs for augmenting Lamictal/psychosis/sleep. Will continue PRN medications as per agitation protocol. Will continue to monitor vitals ,medication compliance  and treatment side effects while patient is here.  Will monitor for medical issues as well as call consult as needed.  Reviewed labs cbc - wnl, cmp - K+ - low -  repeat BMP- is low at 3.4 - will give Kdur 10 meq x 2 doses , Pregnancy test - negative ,UDS- pos for thc and BZD ( received in ED) ,TSH- wnl , lipid panel- wnl , hba1c- pending , PL - pending ,pending EKG for qtc, UA. CSW will continue working on disposition.  Patient to participate in therapeutic milieu .  Teng Decou, MD 03/06/2016, 1:02 PM

## 2016-03-06 NOTE — Progress Notes (Signed)
D:  Patient's self inventory sheet, patient sleeps good, no sleep medication given.  Fair appetite, normal energy level, good concentration.  Denied depression and hopeless.  Rated anxiety #1.  Denied SI.  Denied physical problems.  Pain, arms, worst pain #2 in past 24 hours.  No pain medication.  Goal is to focus on herself while being respectful of others around her.  Plans to listen more and speak less (she rambles when she is nervous).  No discharge plans. A:  Medications administered per MD orders.  Emotional support and encouragement given patient. R:  Denied SI and HI, contracts for safety.   Denied A/V hallucinations.  Safety maintained with 15 minute checks.

## 2016-03-06 NOTE — Plan of Care (Signed)
Problem: Education: Goal: Ability to state activities that reduce stress will improve Outcome: Progressing Nurse discussed anxiety/depression/coping skills with patient.    

## 2016-03-06 NOTE — Progress Notes (Signed)
Adult Psychoeducational Group Note  Date:  03/06/2016 Time:  9:13 PM  Group Topic/Focus:  Wrap-Up Group:   The focus of this group is to help patients review their daily goal of treatment and discuss progress on daily workbooks.   Participation Level:  Active  Participation Quality:  Appropriate  Affect:  Appropriate  Cognitive:  Appropriate  Insight: Appropriate  Engagement in Group:  Engaged  Modes of Intervention:  Discussion  Additional Comments: The [facility] that [facility] be able [facility] discuss your issues.The patient also said that rates today a 8. Octavio Mannshigpen, Idonia Zollinger Lee 03/06/2016, 9:13 PM

## 2016-03-06 NOTE — [facility] Group Notes (Signed)
[facility] LCSW Group Therapy  03/06/2016  1:05 PM  Type of Therapy:  Group therapy  Participation Level:  Active  Participation Quality:  Attentive  Affect:  Flat  Cognitive:  Oriented  Insight:  Limited  Engagement in Therapy:  Limited  Modes of Intervention:  Discussion, Socialization  Summary of Progress/Problems:  Chaplain was here to lead a group on themes of hope and courage. "I believe in karma.  What I put out is what I get back.  It doesn't always work that way, but I still believe it.  I have to try to be the best me I can be."  Talked abut the loss of a recent relationship with some prompting.  Tearful.  "I know I'm still a good person."  Laurie Stevens, Laurie Stevens 03/06/2016 1:26 PM

## 2016-03-06 NOTE — Progress Notes (Signed)
D: Pt denies SI/HI/AVH. Pt is pleasant and cooperative. Pt stated she was doing better.   A: Pt was offered support and encouragement. Pt was given scheduled medications. Pt was encourage to attend groups. Q 15 minute checks were done for safety.   R:Pt attends groups and interacts well with peers and staff. Pt is taking medication. Pt has no complaints at this time.Pt receptive to treatment and safety maintained on unit.

## 2016-03-07 LAB — HEMOGLOBIN A1C
Hgb A1c MFr Bld: 4.9 % (ref 4.8–5.6)
Mean Plasma Glucose: 94 mg/dL

## 2016-03-07 LAB — PROLACTIN: Prolactin: 75.6 ng/mL — ABNORMAL HIGH (ref 4.8–23.3)

## 2016-03-07 NOTE — [facility] Group Notes (Signed)
[facility] Group Notes:  (Clinical Social Work)  03/07/2016    Summary of Progress/Problems:   Today's process group involved patients discussing their feelings related to being hospitalized, as well as how they can use their present feelings to create a plan that could help them avoid future hospitalizations such as this one. The patient expressed the primary feeling about being hospitalized is  "okay, but I wish I could go out more.  It has helped with learning coping skills, but one of them is to go outside."  Type of Therapy:  Group Therapy - Process  Participation Level:  Active  Participation Quality:  Attentive and Sharing  Affect:  Appropriate  Cognitive:  Appropriate  Insight:  Developing/Improving  Engagement in Therapy:  Developing/Improving  Modes of Intervention:  Exploration, Discussion  Ambrose MantleMareida Grossman-Orr, LCSW 03/07/2016, 2:02 PM

## 2016-03-07 NOTE — Progress Notes (Signed)
D:  Patient's self inventory sheet, patient sleeps good, no sleep medication given.  Fair appetite, normal energy level, good concentration.  Denied progress and depression, rated anxiety 1.  Denied withdrawals.  Denied physical problems.  Worst pain #2 in past 24 hours, wrists.  Goal is to breathe through anxiety when she is meeting new people and feeling "rushing""  Plans to get herself to take time.  Wants to get fresh air even if it raims.Marland Kitchen. A:  Medications administered per MD orders.  Emotional support and engagement. R:  Denied SI and HI, contracts for safety.  Denied A/V hallucinations.  Safety maintained with 15 minute checks. .Marland Kitchen

## 2016-03-07 NOTE — [facility] Group Notes (Signed)
[facility] Group Notes:  (Nursing/MHT/Case Management/Adjunct)  Date:  03/07/2016  Time:  6:27 PM  Type of Therapy:  Nurse Education  Laurie Stevens 03/07/2016, 6:27 PM

## 2016-03-07 NOTE — [facility] Group Notes (Signed)

## 2016-03-07 NOTE — Progress Notes (Signed)
[facility] MD Progress Note  03/07/2016 1:10 PM CAIA LOFARO  MRN:  [id number] Subjective: Patient states " I feel more positive."  Objective:Leonarda D Coalsonis a 25 y.o.caucasian female who is single , unemployed , lives with her mother in [place] , has a hx of depression ( not officially diagnosed) , who presented to  to [facility] with psychosis as well as bizarre behavior - hiding behind bushes early AM - running out in to the street and being combative.  Patient seen and chart reviewed.Discussed patient with treatment team.  Pt appears less euphoric , less labile , more linear. Pt has been taking her medications , denies ADRs. Per staff - pt continues to need support , has been observed as attending groups.      Principal Problem: Bipolar disorder, curr episode mixed, severe, with psychotic features ([place]) Diagnosis:   Patient Active Problem List   Diagnosis Date Noted  . Bipolar disorder, curr episode mixed, severe, with psychotic features ([place]) [F31.64] 03/05/2016  . Cannabis use disorder, mild, abuse [F12.10] 03/05/2016  . Abdominal bloating [R14.0] 12/02/2010  . Weight gain [R63.5] 12/02/2010  . Family history of celiac disease [Z83.79] 12/02/2010  . EDEMA [R60.9] 01/17/2008  . CHEST PAIN [R07.9] 01/17/2008  . 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   Past Medical History:  Past Medical History:  Diagnosis Date  . HSP (Henoch Schonlein purpura) ([place]) 05/99   hs no renal involvement hosp with abd pain [place] rx  with prednisone  . Pneumonia 05/04   RML   History reviewed. No pertinent surgical history. Family History:  Family History  Problem Relation Age of Onset  . Celiac disease Sister   . Thyroid disease Sister   . Thyroid disease Sister     autoimmune disease  . Diabetes type II    . Hypertension    . Hypertension Maternal Grandmother   . Cancer Maternal Grandmother   . Cancer -  Cervical Maternal Grandmother   . Suicidality Maternal Grandfather   . Alcoholism Maternal Uncle    Family Psychiatric  History: Please see H&P.  Social History: Please see H&P.  History  Alcohol Use No    Comment: rarely     History  Drug Use    Comment: Marijuana    Social History   Social History  . Marital status: Single    Spouse name: N/A  . Number of children: N/A  . Years of education: N/A   Social History Main Topics  . Smoking status: Never Smoker  . Smokeless tobacco: Never Used  . Alcohol use No     Comment: rarely  . Drug use:      Comment: Marijuana  . Sexual activity: Not Currently   Other Topics Concern  . 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    Works for [employer] over in [place] to move to either [place] or [place] to keep this kind of job   Additional Social History:    Pain Medications: SEE MAR Prescriptions: SEE MAR Over the Counter: SEE MAR History of alcohol / drug use?: Yes Name of Substance 1: Cocaine  1 - Age of First Use: unk 1 - Amount (size/oz): unk 1 - Frequency:  unk 1 - Duration: 1 year 1 - Last Use / Amount: "last year" Name of Substance 2: THC 2 - Age of First Use: 18 2 - Amount (size/oz): unk 2 - Frequency: unk 2 - Duration: unk 2 - Last Use / Amount: "I don't remember" Name of Substance 3: Alcohol  3 - Age of First Use: unk 3 - Amount (size/oz): "A shot" 3 - Frequency: social drinker 3 - Duration: on-going  3 - Last Use / Amount: March 2017              Sleep: improved  Appetite:  Fair  Current Medications: Current Facility-Administered Medications  Medication Dose Route Frequency Provider Last Rate Last Dose  . acetaminophen (TYLENOL) tablet 650 mg  650 mg Oral Q6H PRN Patrecia Pour, NP      . alum & mag hydroxide-simeth (MAALOX/MYLANTA) 200-200-20 MG/5ML suspension 30 mL   30 mL Oral Q4H PRN Patrecia Pour, NP      . feeding supplement (ENSURE ENLIVE) (ENSURE ENLIVE) liquid 237 mL  237 mL Oral BID BM Dameisha Tschida, MD   237 mL at 03/07/16 1303  . lamoTRIgine (LAMICTAL) tablet 25 mg  25 mg Oral Q2000 Ursula Alert, MD   25 mg at 03/06/16 2202  . loratadine (CLARITIN) tablet 10 mg  10 mg Oral Daily Kerrie Buffalo, NP   10 mg at 03/07/16 0811  . LORazepam (ATIVAN) tablet 1 mg  1 mg Oral Q6H PRN Ursula Alert, MD       Or  . LORazepam (ATIVAN) injection 1 mg  1 mg Intramuscular Q6H PRN Geordan Xu, MD      . magnesium hydroxide (MILK OF MAGNESIA) suspension 30 mL  30 mL Oral Daily PRN Patrecia Pour, NP      . multivitamin with minerals tablet 1 tablet  1 tablet Oral Daily Kerrie Buffalo, NP   1 tablet at 03/07/16 [phone]  . QUEtiapine (SEROQUEL) tablet 50 mg  50 mg Oral QHS Ursula Alert, MD   50 mg at 03/06/16 2202    Lab Results:  Results for orders placed or performed during the hospital encounter of 03/04/16 (from the past 48 hour(s))  TSH     Status: None   Collection Time: 03/06/16  6:24 AM  Result Value Ref Range   TSH 2.346 0.350 - 4.500 uIU/mL    Comment: Performed at [facility]  Lipid panel     Status: None   Collection Time: 03/06/16  6:24 AM  Result Value Ref Range   Cholesterol 130 0 - 200 mg/dL   Triglycerides 64 <150 mg/dL   HDL 42 >40 mg/dL   Total CHOL/HDL Ratio 3.1 RATIO   VLDL 13 0 - 40 mg/dL   LDL Cholesterol 75 0 - 99 mg/dL    Comment:        Total Cholesterol/HDL:CHD Risk Coronary Heart Disease Risk Table                     Men   Women  1/2 Average Risk   3.4   3.3  Average Risk       5.0   4.4  2 X Average Risk   9.6   7.1  3 X Average Risk  23.4   11.0        Use the calculated Patient Ratio above and the CHD Risk Table to determine the patient's CHD Risk.        ATP III CLASSIFICATION (  LDL):  <100     mg/dL   Optimal  100-129  mg/dL   Near or Above                    Optimal  130-159  mg/dL    Borderline  160-189  mg/dL   High  >190     mg/dL   Very High Performed at [facility]   Hemoglobin A1c     Status: None   Collection Time: 03/06/16  6:24 AM  Result Value Ref Range   Hgb A1c MFr Bld 4.9 4.8 - 5.6 %    Comment: (NOTE)         Pre-diabetes: 5.7 - 6.4         Diabetes: >6.4         Glycemic control for adults with diabetes: <7.0    Mean Plasma Glucose 94 mg/dL    Comment: (NOTE) Performed At: [facility] [place], [place] [id number] Lindon Romp MD [id number] Performed at [facility]   Prolactin     Status: Abnormal   Collection Time: 03/06/16  6:24 AM  Result Value Ref Range   Prolactin 75.6 (H) 4.8 - 23.3 ng/mL    Comment: (NOTE) Performed At: [facility] [place], [place] [id number] Lindon Romp MD [id number] Performed at [place] metabolic panel     Status: Abnormal   Collection Time: 03/06/16  6:24 AM  Result Value Ref Range   Sodium 139 135 - 145 mmol/L   Potassium 3.4 (L) 3.5 - 5.1 mmol/L   Chloride 105 101 - 111 mmol/L   CO2 27 22 - 32 mmol/L   Glucose, Bld 95 65 - 99 mg/dL   BUN 7 6 - 20 mg/dL   Creatinine, Ser 0.75 0.44 - 1.00 mg/dL   Calcium 9.3 8.9 - 10.3 mg/dL   GFR calc non Af Amer >60 >60 mL/min   GFR calc Af Amer >60 >60 mL/min    Comment: (NOTE) The eGFR has been calculated using the CKD EPI equation. This calculation has not been validated in all clinical situations. eGFR's persistently <60 mL/min signify possible Chronic Kidney Disease.    Anion gap 7 5 - 15    Comment: Performed at [facility]    Blood Alcohol level:  Lab Results  Component Value Date   [facility] <5 08/30/1733    Metabolic Disorder Labs: Lab Results  Component Value Date   HGBA1C 4.9 03/06/2016   MPG 94 03/06/2016   Lab Results  Component Value Date   PROLACTIN 75.6 (H) 03/06/2016   Lab Results  Component Value Date    CHOL 130 03/06/2016   TRIG 64 03/06/2016   HDL 42 03/06/2016   CHOLHDL 3.1 03/06/2016   VLDL 13 03/06/2016   LDLCALC 75 03/06/2016   LDLCALC 87 12/02/2010    Physical Findings: AIMS: Facial and Oral Movements Muscles of Facial Expression: None, normal Lips and Perioral Area: None, normal Jaw: None, normal Tongue: None, normal,Extremity Movements Upper (arms, wrists, hands, fingers): None, normal Lower (legs, knees, ankles, toes): None, normal, Trunk Movements Neck, shoulders, hips: None, normal, Overall Severity Severity of abnormal movements (highest score from questions above): None, normal Incapacitation due to abnormal movements: None, normal Patient's awareness of abnormal movements (rate only patient's report): No Awareness, Dental Status Current problems with teeth and/or dentures?: No Does patient usually wear dentures?: No  CIWA:  CIWA-Ar Total: 1 COWS:  COWS Total Score: 2  Musculoskeletal: Strength & Muscle Tone: within normal limits Gait & Station: normal Patient leans: N/A  Psychiatric Specialty Exam: Physical Exam  Nursing note and vitals reviewed.   Review of Systems  Psychiatric/Behavioral: Positive for substance abuse. The patient is nervous/anxious.   All other systems reviewed and are negative.   Blood pressure 111/80, pulse (!) 115, temperature 98.5 F (36.9 C), temperature source Oral, resp. rate 18, height 5' 6" (1.676 m), weight 64.9 kg (143 lb), SpO2 100 %.Body mass index is 23.08 kg/m.  General Appearance: Fairly Groomed  Eye Contact:  Fair  Speech:  Normal Rate  Volume:  Decreased  Mood:  Anxious and Euphoricimproving  Affect:  Congruent  Thought Process:  Irrelevant and Descriptions of Associations: Circumstantial improving  Orientation:  Full (Time, Place, and Person)  Thought Content:  Delusions, Paranoid Ideation and Rumination improving  Suicidal Thoughts:  No paranoid , irrelevant- potential danger to self or others  Homicidal  Thoughts:  No  Memory:  Immediate;   Fair Recent;   Poor Remote;   Poor  Judgement:  Impaired  Insight:  Shallow  Psychomotor Activity:  Restlessness  Concentration:  Concentration: Fair and Attention Span: Fair  Recall:  [employer] of Knowledge:  Fair  Language:  Fair  Akathisia:  No  Handed:  Right  AIMS (if indicated):   0  Assets:  Desire for Improvement Social Support Talents/Skills  ADL's:  Intact  Cognition:  WNL  Sleep:  Number of Hours: 6.5     Treatment Plan Summary:Tarah D Coalsonis a 25 y.o.caucasian female who is single , unemployed , lives with her mother in [place] , has a hx of depression ( not officially diagnosed) , who presented to  to [facility] with psychosis as well as bizarre behavior - hiding behind bushes early AM - running out in to the street and being combative.  Patient continues to make progress , is less irrelevant and delusional - will continue treatment. Daily contact with patient to assess and evaluate symptoms and progress in treatment and Medication management Will continue Lamictal 25 mg po daily for mood sx. Increased Seroquel to 50 mg po qhs for augmenting Lamictal/psychosis/sleep. Will continue PRN medications as per agitation protocol. Will continue to monitor vitals ,medication compliance  and treatment side effects while patient is here.  Will monitor for medical issues as well as call consult as needed.  Reviewed labs cbc - wnl, cmp - K+ - low -  repeat BMP- is low at 3.4 - will give Kdur 10 meq x 2 doses , Pregnancy test - negative ,UDS- pos for thc and BZD ( received in ED) ,TSH- wnl , lipid panel- wnl , hba1c- wnl , PL - INCREASED- WILL NEED TO MONITORED ,pending EKG for qtc, UA. CSW will continue working on disposition.  Patient to participate in therapeutic milieu .  Bruce Churilla, MD 03/07/2016, 1:10 PM

## 2016-03-07 NOTE — Progress Notes (Signed)
Patient ID: Laurie Stevens, female   DOB: 12/12/1990, 25 y.o.   MRN: [id number]   Pt currently presents with anxious affect and behavior. Pt reports to writer that their goal is to "talk to others more, not only talk to myself about things." Pt states "my family has come to see me which shows me that they do care and I can talk to them." Pt reports good sleep with current medication regimen.   Pt provided with medications per providers orders. Pt's labs and vitals were monitored throughout the night. Pt supported emotionally and encouraged to express concerns and questions. Pt educated on medications.  Pt's safety ensured with 15 minute and environmental checks. Pt currently denies SI/HI and A/V hallucinations. Pt verbally agrees to seek staff if SI/HI or A/VH occurs and to consult with staff before acting on any harmful thoughts. Pt insight is good. Will continue POC.

## 2016-03-07 NOTE — Plan of Care (Signed)
Problem: Education: Goal: Ability to verbalize precipitating factors for violent behavior will improve Outcome: Progressing Nurse discussed depression/anxiety/coping skills with patient.    

## 2016-03-08 MED ORDER — DICLOFENAC SODIUM 1 % TD GEL
2.0000 g | Freq: Four times a day (QID) | TRANSDERMAL | Status: DC
  Administered 2016-03-08 – 2016-03-09 (×3): 2 g via TOPICAL
  Filled 2016-03-08: qty 100

## 2016-03-08 NOTE — Progress Notes (Signed)
Laurie Stevens is seen OOB UAL on the [address] tolerated fair. She is concerned about her right wrist pain ( she says its been bothering  her for four days since she was " put in handcuffs by the police'") and today is crying because she says " the numbness in my right thumb is getting worse..not btter like I thought it should". Upon further discussion with pt, she is able to flex, extend and dorsiflex all of her fingers and thumbs. She has no neurovascular deficit observed and has equal strength, movement and sensation of touch on both hands and fingers. NP notified by this writer of pt's concerns.  Pt completed her daily assessment and on it she wrote she denied SI today and she rated her depression, hopelessness and anxiety " 0/0/0/", respectively. R Safety in place.

## 2016-03-08 NOTE — Progress Notes (Signed)
Psychoeducational Group Note  Date:  03/08/2016 Time:  0025  Group Topic/Focus:  Wrap-Up Group:   The focus of this group is to help patients review their daily goal of treatment and discuss progress on daily workbooks.   Participation Level: Did Not Attend  Participation Quality:  Not Applicable  Affect:  Not Applicable  Cognitive:  Not Applicable  Insight:  Not Applicable  Engagement in Group: Not Applicable  Additional Comments:  The patient did not attend group last evening.    Kelyn Koskela S 03/08/2016, 12:25 AM

## 2016-03-08 NOTE — Progress Notes (Signed)
[facility] Group Notes:  (Nursing/MHT/Case Management/Adjunct)  Date:  03/08/2016  Time:  9:28 PM  Type of Therapy:  Psychoeducational Skills  Participation Level:  Active  Participation Quality:  Appropriate  Affect:  Appropriate  Cognitive:  Appropriate  Insight:  Improving  Engagement in Group:  Developing/Improving  Modes of Intervention:  Education  Summary of Progress/Problems: The patient shared with the group that she had a "pretty good" day overall. She states that she spent quite a bit of time working on her puzzle. As a theme for the day, her support system will be her grandmother.   Hazle CocaGOODMAN, Roberto Romanoski S 03/08/2016, 9:28 PM

## 2016-03-08 NOTE — [facility] Group Notes (Signed)
[facility] Group Notes:  (Nursing/MHT/Case Management/Adjunct)  Date:  03/08/2016  Time:  1030  Type of Therapy:  Nurse Education  - Healthy Support Systems  Participation Level:  Did Not Attend  Participation Quality:    Affect:    Cognitive:    Insight:    Engagement in Group:    Modes of Intervention:    Summary of Progress/Problems: Patient was invited to group however elected to remain in bed.  Merian CapronFriedman, Kayia Billinger [facility] 03/08/2016, 1100

## 2016-03-08 NOTE — [facility] Group Notes (Signed)
[facility] Group Notes:  (Clinical Social Work)   @TODAY @   1:15-2:15PM  Summary of Progress/Problems:   The main focus of today's process group was to   1)  Discuss the importance of adding supports  2)  Identify various supports that could be added for various needs  3)  Talk about barriers to using supports  An emphasis was placed on using counselor, doctor, therapy groups, 12-step groups, and problem-specific support groups to expand supports.  Much detail was given by CSW, but mostly by other patients, on each of these types of professional supports.  The patient expressed full comprehension of the concepts presented, and agreed that there is a need to add more supports.  The patient stated many things that indicated an openness to treatment, and had a lot of questions for the group, accepted responses easily.  She definitely wants therapy and medication at discharge.  Type of Therapy:  Process Group with Motivational Interviewing  Participation Level:  Active  Participation Quality:  Attentive, Sharing and Supportive  Affect:  Appropriate  Cognitive:  Appropriate  Insight:  Engaged  Engagement in Therapy:  Engaged  Modes of Intervention:   Education, Teacher, English as a foreign languageupport and Processing, Activity  Pilgrim's PrideMareida Grossman-Orr, LCSW @TODAY @   4:14 PM

## 2016-03-08 NOTE — Progress Notes (Signed)
[facility] Progress Note  03/08/2016 11:00 AM Laurie Stevens  MRN:  [id number] Subjective: Patient states " I feel better today is the first day actual feel good. I didn't realize how sick I was. My hand still bothers me. My  Thoughts have improved and being on the 400 hall helps me because the people on 500 hall are sick but I know we all need help. But mingling and talking to people is important too. I have been trying to catch up on my rest after being up so long."  Per nurse: Patient's self inventory sheet, patient sleeps good, no sleep medication given.  Fair appetite, normal energy level, good concentration.  Denied progress and depression, rated anxiety 1.  Denied withdrawals.  Denied physical problems.  Worst pain #2 in past 24 hours, wrists.  Goal is to breathe through anxiety when she is meeting new people and feeling "rushing""  Plans to get herself to take time.  Wants to get fresh air even if it raims..  Objective:Laurie D Coalsonis a 25 y.o.caucasian female who is single , unemployed , [place] , has a hx of depression ( not officially diagnosed) , [facility] as well as bizarre behavior - hiding behind bushes early AM - running out in to the street and being combative.  Patient seen and chart reviewed.Discussed patient with nursing staff.   Pt appears less euphoric , less labile , more organized at this time. She Is observed lying in bed, but responds appropriately when called. She reports improvement in thought processes. SHe continues to ruminate about her hand being in pain, due to being handcuffed. Pt has been taking her medications, denies ADRs. Per staff - pt continues to need support , has been observed as attending groups. Her goal today is mingle more and work on Manufacturing systems engineercommunication skills. SHe reports sleeping well and eating well.   Principal Problem: Bipolar disorder, curr episode mixed, severe, with psychotic features  (HCC) Diagnosis:   Patient Active Problem List   Diagnosis Date Noted  . Bipolar disorder, curr episode mixed, severe, with psychotic features (HCC) [F31.64] 03/05/2016  . Cannabis use disorder, mild, abuse [F12.10] 03/05/2016  . Abdominal bloating [R14.0] 12/02/2010  . Weight gain [R63.5] 12/02/2010  . Family history of celiac disease [Z83.79] 12/02/2010  . EDEMA [R60.9] 01/17/2008  . CHEST PAIN [R07.9] 01/17/2008  . 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   Past Medical History:  Past Medical History:  Diagnosis Date  . 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 Pneumonia 05/04   RML   History reviewed. No pertinent surgical history. Family History:  Family History  Problem Relation Age of Onset  . Celiac disease Sister   . Thyroid disease Sister   . Thyroid disease Sister     autoimmune disease  . Diabetes type II    . Hypertension    . Hypertension Maternal Grandmother   . Cancer Maternal Grandmother   . Cancer - Cervical Maternal Grandmother   . Suicidality Maternal Grandfather   . Alcoholism Maternal Uncle    Family Psychiatric  History: Please see H&P.  Social History: Please see H&P.  History  Alcohol Use No    Comment: rarely     History  Drug Use  Comment: Marijuana    Social History   Social History  . Marital status: Single    Spouse name: N/A  . Number of children: N/A  . Years of education: N/A   Social History Main Topics  . Smoking status: Never Smoker  . Smokeless tobacco: Never Used  . Alcohol use No     Comment: rarely  . Drug use:      Comment: Marijuana  . Sexual activity: Not Currently   Other Topics Concern  . 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 [place] to keep this kind of job   Additional Social History:    Pain Medications: SEE MAR Prescriptions: SEE MAR Over the Counter: SEE MAR History of alcohol / drug use?: Yes Name of Substance 1: Cocaine  1 - Age of First Use: unk 1 - Amount (size/oz): unk 1 - Frequency: unk 1 - Duration: 1 year 1 - Last Use / Amount: "last year" Name of Substance 2: THC 2 - Age of First Use: 18 2 - Amount (size/oz): unk 2 - Frequency: unk 2 - Duration: unk 2 - Last Use / Amount: "I don't remember" Name of Substance 3: Alcohol  3 - Age of First Use: unk 3 - Amount (size/oz): "A shot" 3 - Frequency: social drinker 3 - Duration: on-going  3 - Last Use / Amount: March 2017              Sleep: improved  Appetite:  Fair  Current Medications: Current Facility-Administered Medications  Medication Dose Route Frequency Provider Last Rate Last Dose  . acetaminophen (TYLENOL) tablet 650 mg  650 mg Oral Q6H PRN [name] RingsJamison Y Lord, NP   650 mg at 03/07/16 1707  . alum & mag hydroxide-simeth (MAALOX/MYLANTA) 200-200-20 MG/5ML suspension 30 mL  30 mL Oral Q4H PRN [name] RingsJamison Y Lord, NP      . feeding supplement (ENSURE ENLIVE) (ENSURE ENLIVE) liquid 237 mL  237 mL Oral BID BM Saramma Eappen, MD   237 mL at 03/07/16 1303  . lamoTRIgine (LAMICTAL) tablet 25 mg  25 mg Oral Q2000 Jomarie LongsSaramma Eappen, MD   25 mg at 03/07/16 2018  . loratadine (CLARITIN) tablet 10 mg  10 mg Oral Daily Adonis BrookSheila Agustin, NP   10 mg at 03/08/16 0841  . LORazepam (ATIVAN) tablet 1 mg  1 mg Oral Q6H PRN Jomarie LongsSaramma Eappen, MD       Or  . LORazepam (ATIVAN) injection 1 mg  1 mg Intramuscular Q6H PRN Saramma Eappen, MD      . magnesium hydroxide (MILK OF MAGNESIA) suspension 30 mL  30 mL Oral Daily PRN [name] RingsJamison Y Lord, NP      . multivitamin with minerals tablet 1 tablet  1 tablet Oral Daily Adonis BrookSheila Agustin, NP   1 tablet at 03/08/16 [phone]  . QUEtiapine  (SEROQUEL) tablet 50 mg  50 mg Oral QHS Jomarie LongsSaramma Eappen, MD   50 mg at 03/07/16 2142    Lab Results:  No results found for this or any previous visit (from the past 48 hour(s)).  Blood Alcohol level:  Lab Results  Component Value Date   [facility]5 03/03/2016    Metabolic Disorder Labs: Lab Results  Component Value Date   HGBA1C 4.9 03/06/2016   MPG 94 03/06/2016   Lab Results  Component Value Date   PROLACTIN 75.6 (H) 03/06/2016   Lab Results  Component Value Date   CHOL 130 03/06/2016   TRIG 64 03/06/2016   HDL 42 03/06/2016   CHOLHDL 3.1 03/06/2016   VLDL 13 03/06/2016   LDLCALC 75 03/06/2016   LDLCALC 87 12/02/2010    Physical Findings: AIMS: Facial and Oral Movements Muscles of Facial Expression: None, normal Lips and Perioral Area: None, normal Jaw: None, normal Tongue: None, normal,Extremity Movements Upper (arms, wrists, hands, fingers): None, normal Lower (legs, knees, ankles, toes): None, normal, Trunk Movements Neck, shoulders, hips: None, normal, Overall Severity Severity of abnormal movements (highest score from questions above): None, normal Incapacitation due to abnormal movements: None, normal Patient's awareness of abnormal movements (rate only patient's report): No Awareness, Dental Status Current problems with teeth and/or dentures?: No Does patient usually wear dentures?: No  CIWA:  CIWA-Ar Total: 1 COWS:  COWS Total Score: 1  Musculoskeletal: Strength & Muscle Tone: within normal limits Gait & Station: normal Patient leans: N/A  Psychiatric Specialty Exam: Physical Exam  Nursing note and vitals reviewed. Musculoskeletal:  Decreased flexion of the R wrist. No obvious injuries, deformities, bruising or decrease circulation noted.     Review of Systems  Musculoskeletal: Positive for joint pain (R wrist pain).  Psychiatric/Behavioral: Positive for depression (improving), hallucinations (resolved) and substance abuse. Negative for memory loss  and suicidal ideas. The patient is nervous/anxious. The patient does not have insomnia.   All other systems reviewed and are negative.   Blood pressure 108/68, pulse (!) 105, temperature 98.5 F (36.9 C), resp. rate 16, height 5\' 6"  (1.676 m), weight 64.9 kg (143 lb), SpO2 100 %.Body mass index is 23.08 kg/m.  General Appearance: Fairly Groomed  Eye Contact:  Fair  Speech:  Normal Rate  Volume:  Decreased  Mood:  Anxious and Euthymicimproving  Affect:  Congruent  Thought Process:  Irrelevant and Descriptions of Associations: Circumstantial improving  Orientation:  Full (Time, Place, and Person)  Thought Content:  Rumination improving  Suicidal Thoughts:  No paranoid , irrelevant- potential danger to self or others  Homicidal Thoughts:  No  Memory:  Immediate;   Fair Recent;   Fair Remote;   Poor  Judgement:  Intact  Insight:  Present and Shallow  Psychomotor Activity:  Normal  Concentration:  Concentration: Fair and Attention Span: Fair  Recall:  [employer] of Knowledge:  Fair  Language:  Fair  Akathisia:  No  Handed:  Right  AIMS (if indicated):   0  Assets:  Desire for Improvement Social Support Talents/Skills  ADL's:  Intact  Cognition:  WNL  Sleep:  Number of Hours: 6.25     Treatment Plan Summary:Laurie D Coalsonis a 24 y.o.caucasian female who is single , unemployed , lives with her mother in [place] , has a hx of depression ( not officially diagnosed) , who presented to  to [facility] with psychosis as well as bizarre behavior - hiding behind bushes early AM - running out in to the street and being combative.  Patient continues to make progress , is less irrelevant and delusional - will continue treatment. Daily contact with patient to assess and evaluate symptoms and progress in treatment and Medication management Will continue Lamictal 25 mg po daily for mood sx. Increased Seroquel to 50 mg po qhs for augmenting Lamictal/psychosis/sleep. Will continue PRN medications as  per  agitation protocol. Will continue to monitor vitals ,medication compliance  and treatment side effects while patient is here.  Will start Diclofenac gel 2% Qid prn for R wrist pain. No imaging needed.  Will monitor for medical issues as well as call consult as needed.  Reviewed labs cbc - wnl, cmp - K+ - low -  repeat BMP- is low at 3.4 - will give Kdur 10 meq x 2 doses , Pregnancy test - negative ,UDS- pos for thc and BZD ( received in ED) ,TSH- 2.346, lipid panel- wnl , hba1c- 4.9 , PL - INCREASED at 75.6- WILL NEED TO MONITORED ,pending EKG for qtc, UA still pending CSW will continue working on disposition.  Patient to participate in therapeutic milieu .  Truman Hayward, FNP 03/08/2016, 11:00 AM

## 2016-03-08 NOTE — Progress Notes (Signed)
Patient ID: Laurie Stevens, female   DOB: 12/17/1990, 25 y.o.   MRN: [id number]   Pt currently presents with an anxious affect and cooperative behavior. Pt reports to writer that their goal is to "feel better." Pt states "the cream is working for my wrist, I have a lot more movement today." Pt reports good sleep with current medication regimen.  Pt provided with medications per providers orders. Pt's labs and vitals were monitored throughout the night. Pt supported emotionally and encouraged to express concerns and questions. Pt educated on new medication and proper administration.   Pt's safety ensured with 15 minute and environmental checks. Pt currently denies SI/HI and A/V hallucinations. Pt verbally agrees to seek staff if SI/HI or A/VH occurs and to consult with staff before acting on any harmful thoughts. Pt mother visits today, asks writer if pt can be sent home early so they can watch the eclipse and miss traffic. Writer will pass request on to incoming nurse. Will continue POC.

## 2016-03-09 MED ORDER — DICLOFENAC SODIUM 1 % TD GEL: 2.0000 g | Freq: Four times a day (QID) | TRANSDERMAL | Status: DC

## 2016-03-09 MED ORDER — QUETIAPINE FUMARATE 50 MG PO TABS: 50.0000 mg | ORAL_TABLET | Freq: Every day | ORAL | 0 refills | Status: DC

## 2016-03-09 MED ORDER — LAMOTRIGINE 25 MG PO TABS: 25.0000 mg | ORAL_TABLET | Freq: Every day | ORAL | 0 refills | Status: DC

## 2016-03-09 MED ORDER — ADULT MULTIVITAMIN W/MINERALS CH: 1.0000 | ORAL_TABLET | Freq: Every day | ORAL | Status: DC

## 2016-03-09 MED ORDER — LORATADINE 10 MG PO TABS: 10.0000 mg | ORAL_TABLET | Freq: Every day | ORAL | 0 refills | Status: DC

## 2016-03-09 NOTE — Progress Notes (Signed)
Recreation Therapy Notes  Date: 03/09/16 Time: 0930 Location: 300 Hall Group Room  Group Topic: Stress Management  Goal Area(s) Addresses:  Patient will verbalize importance of using healthy stress management.  Patient will identify positive emotions associated with healthy stress management.   Intervention: Stress Management  Activity :  Progressive Muscle Relaxation.  LRT introduced the stress management technique of progressive muscle relaxation to the patients.  Patients were to follow along as LRT read script to engaged in the technique.  Education:  Stress Management, Discharge Planning.   Education Outcome: Acknowledges edcuation/In group clarification offered/Needs additional education  Clinical Observations/Feedback: Pt did not attend group.   Caroll RancherMarjette Kyal Arts, LRT/CTRS    Lillia AbedLindsay, Devonna Oboyle A 03/09/2016 1:13 PM

## 2016-03-09 NOTE — Tx Team (Signed)
Interdisciplinary Treatment Plan Update (Adult)  Date:  03/09/2016   Time Reviewed:  9:10 AM   Progress in Treatment: Attending groups: Yes. Participating in groups:  Yes. Taking medication as prescribed:  Yes. Tolerating medication:  Yes. Family/Significant other contact made:  Yes Patient understands diagnosis:  Yes  As evidenced by seeking help with "confusion about what is real" Discussing patient identified problems/goals with staff:  Yes, see initial care plan. Medical problems stabilized or resolved:  Yes. Denies suicidal/homicidal ideation: Yes. Issues/concerns per patient self-inventory:  No. Other:  New problem(s) identified:  Discharge Plan or Barriers: see below  Reason for Continuation of Hospitalization: Delusions  Depression Medication stabilization Other; describe mood swings, disorganization  Comments:  Officer at bedside reports pt ran out of the house early this am and her family couldn't find her.  About a week ago, She and  boyfriend broke up and she has been having problems since.  She was found behind their house hiding in some bushes. When they found her, she started to run out into the streets, laid in the grass and became combative and aggressive.  Pt was handcuffed during assessment.  Pt states "I am living in a fake world.  I just need love, everyone hates eachother."  Pt appears disheveled.  Will start a trial of Lamictal 25 mg po daily for mood sx. Will start Seroquel 25 mg po qhs for augmenting Lamictal/psychosis/sleep. Will DC Zyprexa . Will make available PRN medications as per agitation protocol.  Estimated length of stay: 0 days  New goal(s):  Review of initial/current patient goals per problem list:   Review of initial/current patient goals per problem list:  1. Goal(s): Patient will participate in aftercare plan   Met: Yes   Target date: 3-5 days post admission date   As evidenced by: Patient will participate within aftercare plan  AEB aftercare provider and housing plan at discharge being identified. 03/05/16:  Return home, follow up outpt   2. Goal (s): Patient will exhibit decreased depressive symptoms and suicidal ideations.   Met: Yes   Target date: 3-5 days post admission date   As evidenced by: Patient will utilize self rating of depression at 3 or below and demonstrate decreased signs of depression or be deemed stable for discharge by MD. 03/05/16:  Rates depression a 4 today 03/09/2016: Pt rates depression at 0/10; denies SI   5. Goal(s): Patient will demonstrate decreased signs of psychosis  * Met: Yes  * Target date: 3-5 days post admission date  * As evidenced by: Patient will demonstrate decreased frequency of AVH or return to baseline function 03/05/16:  C/O inability to discern what is real and what is not 03/09/2016: Pt denies AVH or confusion regarding reality.    6. Goal (s): Patient will demonstrate decreased signs of mania  * Met: Yes * Target date: 3-5 days post admission date  * As evidenced by: Patient demonstrate decreased signs of mania AEB decreased mood instability and return to baseline functioning 03/05/16:  Pt endorses mood swings, willing to try Lamictal trial 03/09/2016: Pt mood is more stable; MD feels that Pt's symptoms have decreased to the point that they can be managed in an outpatient setting.     Attendees: Patient:  03/09/2016 9:10 AM   Family:   03/09/2016 9:10 AM   Physician:  Dr. Parke Poisson, MD 03/09/2016 9:10 AM   Nursing:   Darrol Angel, RN; Leanne Lovely, RN 03/09/2016 9:10 AM   CSW:    Ander Purpura  Madie Reno 03/09/2016 9:10 AM   Other:  03/09/2016 9:10 AM   Other:   03/09/2016 9:10 AM   Other:  Lars Pinks, Nurse CM 03/09/2016 9:10 AM   Other:   03/09/2016 9:10 AM   Other:    03/09/2016 9:10 AM   Other:  03/09/2016 9:10 AM   Other:  03/09/2016 9:10 AM   Other:  03/09/2016 9:10 AM   Other:  03/09/2016 9:10 AM   Other:  03/09/2016 9:10 AM   Other:   03/09/2016 9:10 AM     Scribe for Treatment Team:   Peri Maris, [facility] [facility] [phone]

## 2016-03-09 NOTE — Progress Notes (Signed)
  [facility] :  Will you be returning to the same living situation after discharge:  Yes,  Pt returning home with mother At discharge, do you have transportation home?: Yes,  mother to pick up Do you have the ability to pay for your medications: Yes,  Pt provided with prescriptions  Release of information consent forms completed and in the chart;  Patient's signature needed at discharge.  Patient to Follow up at: Follow-up Information    [facility] Follow up on 04/02/2017.   Why:  at 2:00 with Crystal M for medication management.  Please bring along hospital d/c paperwork. Contact information: [address]  [place] [336] [phone]          Next level of care provider has access to [facility] Link:no  Safety Planning and Suicide Prevention discussed: Yes,  with mother; see SPE note  Have you used any form of tobacco in the last 30 days? (Cigarettes, Smokeless Tobacco, Cigars, and/or Pipes): No  Has patient been referred to the Quitline?: N/A patient is not a smoker  Patient has been referred for addiction treatment: N/A  Elaina HoopsLauren M Carter 03/09/2016, 10:11 AM

## 2016-03-09 NOTE — [facility] Suicide Risk Assessment (Signed)
[facility] Discharge Suicide Risk Assessment   Principal Problem: Bipolar disorder, curr episode mixed, severe, with psychotic features [facility]) Discharge Diagnoses:  Patient Active Problem List   Diagnosis Date Noted  . Bipolar disorder, curr episode mixed, severe, with psychotic features ([place]) [F31.64] 03/05/2016  . Cannabis use disorder, mild, abuse [F12.10] 03/05/2016  . Abdominal bloating [R14.0] 12/02/2010  . Weight gain [R63.5] 12/02/2010  . Family history of celiac disease [Z83.79] 12/02/2010  . EDEMA [R60.9] 01/17/2008  . CHEST PAIN [R07.9] 01/17/2008  . 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 rate 18, height 5' 6"  (1.676 m), weight 143 lb (64.9 kg), SpO2 100 %.Body mass index is 23.08 kg/m.  General Appearance: Well Groomed  Eye Contact::  Good  Speech:  Normal Rate409  Volume:  Normal  Mood:  improved, minimizes depression at this time   Affect:  Appropriate and reactive   Thought Process:  Linear  Orientation:  Full (Time, Place, and Person)  Thought Content:  no hallucinations, no delusions , not internally preoccupied   Suicidal Thoughts:  No- denies any suicidal ideations, no self injurious ideations  Homicidal Thoughts:  No- denies any homicidal or violent ideations  Memory:  recent and remote grossly intact   Judgement:  Other:  improved   Insight:  improved   Psychomotor Activity:  Normal  Concentration:  Good  Recall:  Good  Fund of Knowledge:Good  Language: Negative  Akathisia:  Negative  Handed:  Right  AIMS (if indicated):     Assets:  Desire for  Improvement Resilience  Sleep:  Number of Hours: 6  Cognition: WNL  ADL's:  Intact   Mental Status Per Nursing Assessment::   On Admission:  NA  Demographic Factors:  25 year old single female, no children, currently living with mother .   Loss Factors: Recent break up with her fiance, sister recently married   Historical Factors: One suicide attempt by overdosing in 2011, no history of self cutting, has been diagnosed with Bipolar Disorder in the past,  Cannabis abuse   Risk Reduction Factors:   Sense of responsibility to family, Living with another person, especially a relative, Positive social support and Positive coping skills or problem solving skills  Continued Clinical Symptoms:  At this time patient is alert, attentive, describes mood as improved, and minimizes depression at this time, no thought disorder, no suicidal or homicidal ideations, no hallucinations, no delusions, future oriented. Denies medication side effects.  Cognitive Features That Contribute To Risk:  No gross cognitive deficits noted upon discharge. Is alert , attentive, and oriented x 3   Suicide Risk:  Mild:  Suicidal ideation of limited frequency, intensity, duration, and specificity.  There are no identifiable plans, no associated intent, mild dysphoria and related symptoms, good self-control (both objective and subjective assessment), few other risk factors, and identifiable protective factors, including available and accessible social support.  Follow-up [facility] Follow up on 04/02/2016.   Why:  at 2:00 with [place] M for medication management.  Please bring along hospital d/c paperwork. Contact information: [address]  [place]           Plan Of Care/Follow-up recommendations:  Activity:  as tolerated  Diet:  Regular  Tests:  NA Other:  See below   Patient is leaving in good spirits  Plans to go live with mother  Plans to  follow up as above  Encouraged patient to maintain abstinence from cannabis /illegal substances .  Neita Garnet, MD 03/09/2016, 12:00 PM

## 2016-03-09 NOTE — Progress Notes (Signed)
Discharge note: Pt received both written and verbal discharge instructions. Pt verbalized understanding of discharge instructions. Pt agreed to f/u appt and med regimen. Pt received SRA, AVS, transition record and prescriptions. Pt obtained belongings from room and locker. Pt safely discharged to the lobby.

## 2016-03-09 NOTE — Discharge Summary (Signed)
Physician Discharge Summary Note  Patient:  Laurie Stevens is an 25 y.o., female MRN:  [id number] DOB:  1991-04-07 Patient phone:  [phone] (home)  Patient address:   [address] [address],  Total Time spent with patient: Greater than 30 minutes  Date of Admission:  03/04/2016  Date of Discharge: 03-09-16  Reason for Admission: Worsening symptoms of Bipolar disorder with psychosis.  Principal Problem: Bipolar disorder, curr episode mixed, severe, with psychotic features [facility])  Discharge Diagnoses: Patient Active Problem List   Diagnosis Date Noted  . Bipolar disorder, curr episode mixed, severe, with psychotic features ([place]) [F31.64] 03/05/2016  . Cannabis use disorder, mild, abuse [F12.10] 03/05/2016  . Abdominal bloating [R14.0] 12/02/2010  . Weight gain [R63.5] 12/02/2010  . Family history of celiac disease [Z83.79] 12/02/2010  . EDEMA [R60.9] 01/17/2008  . CHEST PAIN [R07.9] 01/17/2008  . 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 HSP (Henoch Schonlein purpura) ([place]) 05/99   hs no renal involvement hosp with abd pain [place] rx  with prednisone  . Pneumonia 05/04   RML   History reviewed. No pertinent surgical history. Family History:  Family History  Problem Relation Age of Onset  . Celiac disease Sister   . Thyroid disease Sister   . Thyroid disease Sister     autoimmune disease  . Diabetes type II    . Hypertension    . Hypertension Maternal Grandmother   . Cancer Maternal Grandmother   . Cancer - Cervical Maternal Grandmother   . Suicidality Maternal Grandfather   . Alcoholism Maternal Uncle    Family Psychiatric  History: See H&P  Social History:  History  Alcohol Use No    Comment: rarely     History  Drug Use    Comment: Marijuana    Social History   Social History  . Marital status: Single    Spouse name:  N/A  . Number of children: N/A  . Years of education: N/A   Social History Main Topics  . Smoking status: Never Smoker  . Smokeless tobacco: Never Used  . Alcohol use No     Comment: rarely  . Drug use:      Comment: Marijuana  . Sexual activity: Not Currently   Other Topics Concern  . 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   Laurie Stevens was admitted to the hospital for worsening symptoms of Bipolar disorder & crisis management due to presence of psychosis. She has history of depression, although not officially diagnosed. She was in need of mood stabilization treatments. Her UDS test reports was positive for Benzodiazepine & THC. However, she was not presenting with any substance withdrawal symptoms. After her admission assessment, Laurie Stevens's presenting symptoms were identified. The medication regimen targeting those symptoms were initiated. She was medicated & discharged on; Lamictal 25 mg for  mood stabilization & Seroquel 50 mg for mood control. Her other pre-existing medical problems were identified & monitored by resuming her on all pertinent home medications for those health issues. Laurie Stevens was enrolled & participated in the group counseling sessions being offered & held on this unit. She learned coping skills..  During the course of her hospitalization, Laurie Stevens's improvement was monitored by observation & her daily report of symptom reduction noted.  Her emotional & mental status were monitored by daily self-inventory reports completed by her & the clinical  staff. She was evaluated by the treatment team for stability and plans for continued recovery after discharge. Laurie Stevens's motivation was an integral factor in her mood stability. She was offered further treatment options upon discharge as noted below.   Upon discharge, Laurie Stevens was both mentally & medically stable. She denies suicidal/homicidal ideations, auditory/visual/tactile hallucinations, delusional thoughts or paranoia. She left [facility] with all belongings in no distress. Transportation per mother.  Physical Findings: AIMS: Facial and Oral Movements Muscles of Facial Expression: None, normal Lips and Perioral Area: None, normal Jaw: None, normal Tongue: None, normal,Extremity Movements Upper (arms, wrists, hands, fingers): None, normal Lower (legs, knees, ankles, toes): None, normal, Trunk Movements Neck, shoulders, hips: None, normal, Overall Severity Severity of abnormal movements (highest score from questions above): None, normal Incapacitation due to abnormal movements: None, normal Patient's awareness of abnormal movements (rate only patient's report): No Awareness, Dental Status Current problems with teeth and/or dentures?: No Does patient usually wear dentures?: No  CIWA:  CIWA-Ar Total: 1 COWS:  COWS Total Score: 1  Musculoskeletal: Strength & Muscle Tone: within normal limits Gait & Station: normal Patient leans: N/A  Psychiatric Specialty Exam: Physical Exam  Constitutional: She appears well-developed.  HENT:  Head: Normocephalic.  Eyes: Pupils are equal, round, and reactive to light.  Cardiovascular: Normal rate.   Respiratory: Effort normal.  GI: Soft.  Genitourinary:  Genitourinary Comments: Deffered  Musculoskeletal: Normal range of motion.  Neurological: She is alert.  Skin: Skin is warm.    Review of Systems  Constitutional: Negative.   HENT: Negative.   Eyes: Negative.   Respiratory: Negative.   Cardiovascular: Negative.   Gastrointestinal: Negative.    Genitourinary: Negative.   Musculoskeletal: Negative.   Skin: Negative.   Neurological: Negative.   Endo/Heme/Allergies: Negative.   Psychiatric/Behavioral: Positive for depression (Stable) and substance abuse (Hx, cannabis abuse). Negative for hallucinations, memory loss and suicidal ideas. The patient has insomnia (Stable). The patient is not nervous/anxious.     Blood pressure 111/75, pulse 92, temperature 98 F (36.7 C), temperature source Oral, resp. rate 18, height 5' 6"  (1.676 m), weight 64.9 kg (143 lb), SpO2 100 %.Body mass index is 23.08 kg/m.  See Md's SRA   Have you used any form of tobacco in the last 30 days? (Cigarettes, Smokeless Tobacco, Cigars, and/or Pipes): No  Has this patient used any form of tobacco in the last 30 days? (Cigarettes, Smokeless Tobacco, Cigars, and/or Pipes): No  Blood Alcohol level:  Lab Results  Component Value Date   ETH <5 35/00/9381   Metabolic Disorder Labs:  Lab Results  Component Value Date   HGBA1C 4.9 03/06/2016   MPG 94 03/06/2016   Lab Results  Component Value Date   PROLACTIN 75.6 (H) 03/06/2016   Lab Results  Component Value Date   CHOL 130 03/06/2016   TRIG 64 03/06/2016   HDL 42 03/06/2016   CHOLHDL 3.1 03/06/2016   VLDL 13 03/06/2016   LDLCALC 75 03/06/2016  [place] 87 12/02/2010   See Psychiatric Specialty Exam and Suicide Risk Assessment completed by Attending Physician prior to discharge.  Discharge destination:  Home  Is patient on multiple antipsychotic therapies at discharge:  No   Has Patient had three or more failed trials of antipsychotic monotherapy by history:  No  Recommended Plan for Multiple Antipsychotic Therapies: NA    Medication List    STOP taking these medications   ALLERGY MEDICINE PO   DIUREX 50-162.5 MG Tabs Generic drug:  Caffeine-Magnesium Salicylate     TAKE these medications     Indication  diclofenac sodium 1 % Gel Commonly known as:  VOLTAREN Apply 2 g topically 4  (four) times daily. For arthritic pain  Indication:  Joint Damage causing Pain and Loss of Function   lamoTRIgine 25 MG tablet Commonly known as:  LAMICTAL Take 1 tablet (25 mg total) by mouth daily at 8 pm. For mood stabilization  Indication:  Mood stabilization   loratadine 10 MG tablet Commonly known as:  CLARITIN Take 1 tablet (10 mg total) by mouth daily. (May purchase this medicine from over the counter at the pharmacy): For allergies  Indication:  Perennial Allergic Rhinitis, Hayfever   multivitamin with minerals Tabs tablet Take 1 tablet by mouth daily. For low Vitamin What changed:  additional instructions  Indication:  Vitamin deficiency   QUEtiapine 50 MG tablet Commonly known as:  SEROQUEL Take 1 tablet (50 mg total) by mouth at bedtime. For mood control  Indication:  Mood control      Follow-up [facility] Follow up on 04/02/2016.   Why:  at 2:00 with Paoli M for medication management.  Please bring along hospital d/c paperwork. Contact information: [address] [place] [address] [phone]          Follow-up recommendations: Activity:  As tolerated Diet: As recommended by your primary care doctor. Keep all scheduled follow-up appointments as recommended.   Comments: Patient is instructed prior to discharge to: Take all medications as prescribed by his/her mental healthcare provider. Report any adverse effects and or reactions from the medicines to his/her outpatient provider promptly. Patient has been instructed & cautioned: To not engage in alcohol and or illegal drug use while on prescription medicines. In the event of worsening symptoms, patient is instructed to call the crisis hotline, 911 and or go to the nearest ED for appropriate evaluation and treatment of symptoms. To follow-up with his/her primary care provider for your other medical issues, concerns and or health care needs.   Signed: Encarnacion Slates, NP,  PMHNP, FNP-BC 03/10/2016, 4:14 PM   Patient seen, Suicide Assessment Completed.  Disposition Plan Reviewed

## 2016-08-04 ENCOUNTER — Emergency Department
Admission: EM | Admit: 2016-08-04 | Discharge: 2016-08-05 | Disposition: A | Discharge status: Other Acute Inpatient Hospital | Payer: PRIVATE HEALTH INSURANCE | Attending: Emergency Medicine | Admitting: Emergency Medicine

## 2016-08-04 ENCOUNTER — Encounter: Payer: Self-pay | Admitting: Emergency Medicine

## 2016-08-04 DIAGNOSIS — D690 Allergic purpura: Secondary | ICD-10-CM | POA: Diagnosis present

## 2016-08-04 DIAGNOSIS — F23 Brief psychotic disorder: Secondary | ICD-10-CM | POA: Insufficient documentation

## 2016-08-04 DIAGNOSIS — F1210 Cannabis abuse, uncomplicated: Secondary | ICD-10-CM | POA: Diagnosis present

## 2016-08-04 DIAGNOSIS — F3164 Bipolar disorder, current episode mixed, severe, with psychotic features: Secondary | ICD-10-CM | POA: Diagnosis present

## 2016-08-04 DIAGNOSIS — Z79899 Other long term (current) drug therapy: Secondary | ICD-10-CM | POA: Insufficient documentation

## 2016-08-04 NOTE — ED Triage Notes (Addendum)
Patient ambulatory to triage with steady gait, without difficulty, pt tearful, stating "I'm scared, I don't want to have surgery"; [place] [facility] [place]; pt admits to SI but no plan; pt intermittently hysterical then quiet; uncooperative with attempts at vs and lab draw

## 2016-08-05 ENCOUNTER — Inpatient Hospital Stay
Admission: RE | Admit: 2016-08-05 | Discharge: 2016-08-09 | DRG: 885 | Disposition: A | Discharge status: Home / Self Care | Payer: No Typology Code available for payment source | Source: Intra-hospital | Attending: Psychiatry | Admitting: Psychiatry

## 2016-08-05 DIAGNOSIS — Z8049 Family history of malignant neoplasm of other genital organs: Secondary | ICD-10-CM

## 2016-08-05 DIAGNOSIS — F3160 Bipolar disorder, current episode mixed, unspecified: Secondary | ICD-10-CM | POA: Diagnosis present

## 2016-08-05 DIAGNOSIS — G4700 Insomnia, unspecified: Secondary | ICD-10-CM | POA: Diagnosis present

## 2016-08-05 DIAGNOSIS — Z9119 Patient's noncompliance with other medical treatment and regimen: Secondary | ICD-10-CM

## 2016-08-05 DIAGNOSIS — Z639 Problem related to primary support group, unspecified: Secondary | ICD-10-CM

## 2016-08-05 DIAGNOSIS — Z8349 Family history of other endocrine, nutritional and metabolic diseases: Secondary | ICD-10-CM

## 2016-08-05 DIAGNOSIS — F419 Anxiety disorder, unspecified: Secondary | ICD-10-CM | POA: Diagnosis present

## 2016-08-05 DIAGNOSIS — F1220 Cannabis dependence, uncomplicated: Secondary | ICD-10-CM | POA: Diagnosis present

## 2016-08-05 DIAGNOSIS — Z818 Family history of other mental and behavioral disorders: Secondary | ICD-10-CM

## 2016-08-05 DIAGNOSIS — Z8379 Family history of other diseases of the digestive system: Secondary | ICD-10-CM | POA: Diagnosis not present

## 2016-08-05 DIAGNOSIS — Z8249 Family history of ischemic heart disease and other diseases of the circulatory system: Secondary | ICD-10-CM | POA: Diagnosis not present

## 2016-08-05 DIAGNOSIS — Z833 Family history of diabetes mellitus: Secondary | ICD-10-CM

## 2016-08-05 DIAGNOSIS — F3164 Bipolar disorder, current episode mixed, severe, with psychotic features: Secondary | ICD-10-CM

## 2016-08-05 DIAGNOSIS — Z811 Family history of alcohol abuse and dependence: Secondary | ICD-10-CM | POA: Diagnosis not present

## 2016-08-05 DIAGNOSIS — Z79899 Other long term (current) drug therapy: Secondary | ICD-10-CM

## 2016-08-05 LAB — URINE DRUG SCREEN, QUALITATIVE (ARMC ONLY)
AMPHETAMINES, UR SCREEN: NOT DETECTED
BENZODIAZEPINE, UR SCRN: NOT DETECTED
Barbiturates, Ur Screen: NOT DETECTED
CANNABINOID 50 NG, UR ~~LOC~~: POSITIVE — AB
Cocaine Metabolite,Ur ~~LOC~~: NOT DETECTED
MDMA (ECSTASY) UR SCREEN: NOT DETECTED
Methadone Scn, Ur: NOT DETECTED
OPIATE, UR SCREEN: NOT DETECTED
PHENCYCLIDINE (PCP) UR S: NOT DETECTED
Tricyclic, Ur Screen: NOT DETECTED

## 2016-08-05 LAB — COMPREHENSIVE METABOLIC PANEL
ALBUMIN: 4.8 g/dL (ref 3.5–5.0)
ALK PHOS: 65 U/L (ref 38–126)
ALT: 20 U/L (ref 14–54)
AST: 24 U/L (ref 15–41)
Anion gap: 8 (ref 5–15)
BILIRUBIN TOTAL: 1 mg/dL (ref 0.3–1.2)
BUN: 7 mg/dL (ref 6–20)
CALCIUM: 9.2 mg/dL (ref 8.9–10.3)
CO2: 22 mmol/L (ref 22–32)
CREATININE: 0.65 mg/dL (ref 0.44–1.00)
Chloride: 108 mmol/L (ref 101–111)
GFR calc Af Amer: >60 mL/min (ref >60)
GLUCOSE: 107 mg/dL — AB (ref 65–99)
Potassium: 3.6 mmol/L (ref 3.5–5.1)
Sodium: 138 mmol/L (ref 135–145)
TOTAL PROTEIN: 8.1 g/dL (ref 6.5–8.1)

## 2016-08-05 LAB — URINALYSIS, COMPLETE (UACMP) WITH MICROSCOPIC
BACTERIA UA: NONE SEEN
Bilirubin Urine: NEGATIVE
Glucose, UA: NEGATIVE mg/dL
HGB URINE DIPSTICK: NEGATIVE
Ketones, ur: 5 mg/dL — AB
Leukocytes, UA: NEGATIVE
Nitrite: NEGATIVE
PROTEIN: NEGATIVE mg/dL
RBC / HPF: NONE SEEN RBC/hpf (ref 0–5)
SPECIFIC GRAVITY, URINE: 1.005 (ref 1.005–1.030)
WBC UA: NONE SEEN WBC/hpf (ref 0–5)
pH: 8 (ref 5.0–8.0)

## 2016-08-05 LAB — CBC
HEMATOCRIT: 41.5 % (ref 35.0–47.0)
Hemoglobin: 14.5 g/dL (ref 12.0–16.0)
MCH: 33 pg (ref 26.0–34.0)
MCHC: 35 g/dL (ref 32.0–36.0)
MCV: 94.3 fL (ref 80.0–100.0)
PLATELETS: 263 10*3/uL (ref 150–440)
RBC: 4.4 MIL/uL (ref 3.80–5.20)
RDW: 12.9 % (ref 11.5–14.5)
WBC: 9.3 10*3/uL (ref 3.6–11.0)

## 2016-08-05 LAB — ETHANOL

## 2016-08-05 LAB — POCT PREGNANCY, URINE: Preg Test, Ur: NEGATIVE

## 2016-08-05 LAB — ACETAMINOPHEN LEVEL: Acetaminophen (Tylenol), Serum: <10 ug/mL — ABNORMAL LOW (ref 10–30)

## 2016-08-05 LAB — SALICYLATE LEVEL: Salicylate Lvl: <7 mg/dL (ref 2.8–30.0)

## 2016-08-05 MED ORDER — HALOPERIDOL LACTATE 5 MG/ML IJ SOLN
5.0000 mg | Freq: Once | INTRAMUSCULAR | Status: AC
  Administered 2016-08-05: 5 mg via INTRAMUSCULAR

## 2016-08-05 MED ORDER — LORAZEPAM 2 MG/ML IJ SOLN
INTRAMUSCULAR | Status: AC
  Administered 2016-08-05: 2 mg via INTRAVENOUS
  Filled 2016-08-05: qty 1

## 2016-08-05 MED ORDER — QUETIAPINE FUMARATE 25 MG PO TABS
50.0000 mg | ORAL_TABLET | Freq: Two times a day (BID) | ORAL | Status: DC
  Administered 2016-08-05: 50 mg via ORAL
  Filled 2016-08-05: qty 2

## 2016-08-05 MED ORDER — DIPHENHYDRAMINE HCL 50 MG/ML IJ SOLN
50.0000 mg | Freq: Once | INTRAMUSCULAR | Status: AC
  Administered 2016-08-05: 01:00:00 via INTRAVENOUS

## 2016-08-05 MED ORDER — ACETAMINOPHEN 325 MG PO TABS: 650.0000 mg | ORAL_TABLET | Freq: Four times a day (QID) | ORAL | Status: DC | PRN

## 2016-08-05 MED ORDER — HALOPERIDOL LACTATE 5 MG/ML IJ SOLN
INTRAMUSCULAR | Status: AC
  Administered 2016-08-05: 5 mg via INTRAMUSCULAR
  Filled 2016-08-05: qty 1

## 2016-08-05 MED ORDER — ALUM & MAG HYDROXIDE-SIMETH 200-200-20 MG/5ML PO SUSP: 30.0000 mL | ORAL | Status: DC | PRN

## 2016-08-05 MED ORDER — QUETIAPINE FUMARATE 25 MG PO TABS
50.0000 mg | ORAL_TABLET | Freq: Two times a day (BID) | ORAL | Status: DC
  Administered 2016-08-06: 50 mg via ORAL
  Filled 2016-08-05: qty 2

## 2016-08-05 MED ORDER — DIPHENHYDRAMINE HCL 50 MG/ML IJ SOLN
INTRAMUSCULAR | Status: AC
  Filled 2016-08-05: qty 1

## 2016-08-05 MED ORDER — LORAZEPAM 2 MG/ML IJ SOLN
2.0000 mg | Freq: Once | INTRAMUSCULAR | Status: AC
  Administered 2016-08-05: 2 mg via INTRAVENOUS

## 2016-08-05 MED ORDER — MAGNESIUM HYDROXIDE 400 MG/5ML PO SUSP: 30.0000 mL | Freq: Every day | ORAL | Status: DC | PRN

## 2016-08-05 NOTE — ED Notes (Signed)
Pt transferred from main ed , she is very tearful and upset about being here she did not know where she was at the time,, and does not remember what happened last night, I spoke with her sister and boyfriend who confirm she has been diag with bi polar and has not taken her meds, she or no one in her family knows how or why the police brought her to the hospital . She was alone at the time . SHe now denies being suicidal but does seem to have a poor thought process.She is waiting to be seen by psych md

## 2016-08-05 NOTE — Consult Note (Signed)
[facility] Psychiatry Consult   Reason for Consult:  Consult for 26 year old woman who came to the hospital after being picked up by law enforcement Referring Physician:  Owens Shark Patient Identification: Laurie Stevens MRN:  [id number] Principal Diagnosis: Bipolar disorder, curr episode mixed, severe, with psychotic features [facility]) Diagnosis:   Patient Active Problem List   Diagnosis Date Noted  . Bipolar disorder, curr episode mixed, severe, with psychotic features ([place]) [F31.64] 03/05/2016  . Cannabis use disorder, mild, abuse [F12.10] 03/05/2016  . Abdominal bloating [R14.0] 12/02/2010  . Weight gain [R63.5] 12/02/2010  . Family history of celiac disease [Z83.79] 12/02/2010  . EDEMA [R60.9] 01/17/2008  . CHEST PAIN [R07.9] 01/17/2008  . 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 female patient admitted with "I don't know".  HPI:  Patient interviewed. Chart reviewed. 26 year old woman was pulled over by law enforcement last night for driving erratically. Started acting bizarre and was brought to the hospital. Was described as bizarre and confused as soon as she got here. Patient is not a very effective historian. She tells me that she was driving around last night but can't tell me exactly why. Patient says that her mood feels anxious and upset and bad much of the time. She talks about how she hates her job and feels trapped. She has chronic problems with sleep. She is supposed to be taking lamotrigine and Seroquel but has not been taking them lately. She tells me that she gets "easily distracted by sound" and that she hears more sounds than normal people do. I suspect this is hallucinations but she has no insight. Patient says she drinks only a little but that she smokes and a normal amount of marijuana.  Social history: Stays in an RV vehicle parked next to her family's home in [place]. Works at  [employer] job. Dislikes it. Sounds like she has problems with getting along with her family and other people as well.  Medical history: Patient had a childhood blood disorder as a youth. That usually is not something that causes problems as an adult. Otherwise seems to be in reasonably good physical health as an adult.  Substance abuse history: She admits that she smokes several bowls of marijuana a day and it's reported by the police they think that she had lots of edible marijuana in the car with her.  Past Psychiatric History: Patient has been treated psychiatrically in the past. This summer she was admitted to behavioral health Hospital in [place]. Appeared to probably have bipolar disorder. Was started on lamotrigine and Seroquel. Did not follow-up with treatment after leaving the hospital. Denies any history of suicide attempts  Risk to Self: Is patient at risk for suicide?: No, but patient needs Medical Clearance Risk to Others: Homicidal Ideation: No Thoughts of Harm to Others: No Current Homicidal Intent: No Current Homicidal Plan: No Access to Homicidal Means: No Identified Victim: Reports of none Prior Inpatient Therapy:   Prior Outpatient Therapy:    Past Medical History:  Past Medical History:  Diagnosis Date  . HSP (Henoch Schonlein purpura) ([place]) 05/99   hs no renal involvement hosp with abd pain [place] rx  with prednisone  . Pneumonia 05/04   RML   History reviewed. No pertinent surgical history. Family History:  Family History  Problem Relation Age of Onset  . Celiac disease Sister   . Thyroid disease Sister   .  Thyroid disease Sister     autoimmune disease  . Diabetes type II    . Hypertension    . Hypertension Maternal Grandmother   . Cancer Maternal Grandmother   . Cancer - Cervical Maternal Grandmother   . Suicidality Maternal Grandfather   . Alcoholism Maternal Uncle    Family Psychiatric  History: Denies knowing of any family history Social History:   History  Alcohol Use No    Comment: rarely     History  Drug Use  . Types: Marijuana    Comment: Marijuana    Social History   Social History  . Marital status: Single    Spouse name: N/A  . Number of children: N/A  . Years of education: N/A   Social History Main Topics  . Smoking status: Never Smoker  . Smokeless tobacco: Never Used  . Alcohol use No     Comment: rarely  . Drug use:     Types: Marijuana     Comment: Marijuana  . Sexual activity: Not Currently   Other Topics Concern  . 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 Lactose Intolerance (Gi) Diarrhea    Labs:  Results for orders placed or performed during the hospital encounter of 08/04/16 (from the past 48 hour(s))  Comprehensive metabolic panel     Status: Abnormal   Collection Time: 08/05/16 12:15 AM  Result Value Ref Range   Sodium 138 135 - 145 mmol/L   Potassium 3.6 3.5 - 5.1 mmol/L   Chloride 108 101 - 111 mmol/L   CO2 22 22 - 32 mmol/L   Glucose, Bld 107 (H) 65 - 99 mg/dL   BUN 7 6 - 20 mg/dL   Creatinine, Ser 0.65 0.44 - 1.00 mg/dL   Calcium 9.2 8.9 - 10.3 mg/dL   Total Protein 8.1 6.5 - 8.1 g/dL   Albumin 4.8 3.5 - 5.0 g/dL   AST 24 15 - 41 U/L   ALT 20 14 - 54 U/L   Alkaline Phosphatase 65 38 - 126 U/L   Total Bilirubin 1.0 0.3 - 1.2 mg/dL   GFR calc non Af Amer >60 >60 mL/min   GFR calc Af Amer >60 >60 mL/min    Comment: (NOTE) The eGFR has been calculated using the CKD EPI equation. This calculation has not been validated in all clinical situations. eGFR's persistently <60 mL/min signify possible Chronic Kidney Disease.    Anion gap 8 5 - 15  Ethanol     Status: None    Collection Time: 08/05/16 12:15 AM  Result Value Ref Range   Alcohol, Ethyl (B) <5 <5 mg/dL    Comment:        LOWEST DETECTABLE LIMIT FOR SERUM ALCOHOL IS 5 mg/dL FOR MEDICAL PURPOSES ONLY   Salicylate level     Status: None   Collection Time: 08/05/16 12:15 AM  Result Value Ref Range   Salicylate Lvl <7.0 2.8 - 30.0 mg/dL  Acetaminophen level     Status: Abnormal   Collection Time: 08/05/16 12:15 AM  Result Value Ref  Range   Acetaminophen (Tylenol), Serum <10 (L) 10 - 30 ug/mL    Comment:        THERAPEUTIC CONCENTRATIONS VARY SIGNIFICANTLY. A RANGE OF 10-30 ug/mL MAY BE AN EFFECTIVE CONCENTRATION FOR MANY PATIENTS. HOWEVER, SOME ARE BEST TREATED AT CONCENTRATIONS OUTSIDE THIS RANGE. ACETAMINOPHEN CONCENTRATIONS >150 ug/mL AT 4 HOURS AFTER INGESTION AND >50 ug/mL AT 12 HOURS AFTER INGESTION ARE OFTEN ASSOCIATED WITH TOXIC REACTIONS.   cbc     Status: None   Collection Time: 08/05/16 12:15 AM  Result Value Ref Range   WBC 9.3 3.6 - 11.0 K/uL   RBC 4.40 3.80 - 5.20 MIL/uL   Hemoglobin 14.5 12.0 - 16.0 g/dL   HCT 41.5 35.0 - 47.0 %   MCV 94.3 80.0 - 100.0 fL   MCH 33.0 26.0 - 34.0 pg   MCHC 35.0 32.0 - 36.0 g/dL   RDW 12.9 11.5 - 14.5 %   Platelets 263 150 - 440 K/uL  Urine Drug Screen, Qualitative     Status: Abnormal   Collection Time: 08/05/16 12:33 AM  Result Value Ref Range   Tricyclic, Ur Screen NONE DETECTED NONE DETECTED   Amphetamines, Ur Screen NONE DETECTED NONE DETECTED   MDMA (Ecstasy)Ur Screen NONE DETECTED NONE DETECTED   Cocaine Metabolite,Ur [place] NONE DETECTED NONE DETECTED   Opiate, Ur Screen NONE DETECTED NONE DETECTED   Phencyclidine (PCP) Ur S NONE DETECTED NONE DETECTED   Cannabinoid 50 Ng, Ur [place] POSITIVE (A) NONE DETECTED   Barbiturates, Ur Screen NONE DETECTED NONE DETECTED   Benzodiazepine, Ur Scrn NONE DETECTED NONE DETECTED   Methadone Scn, Ur NONE DETECTED NONE DETECTED    Comment: (NOTE) 767  Tricyclics, urine               Cutoff  1000 ng/mL 200  Amphetamines, urine             Cutoff 1000 ng/mL 300  MDMA (Ecstasy), urine           Cutoff 500 ng/mL 400  Cocaine Metabolite, urine       Cutoff 300 ng/mL 500  Opiate, urine                   Cutoff 300 ng/mL 600  Phencyclidine (PCP), urine      Cutoff 25 ng/mL 700  Cannabinoid, urine              Cutoff 50 ng/mL 800  Barbiturates, urine             Cutoff 200 ng/mL 900  Benzodiazepine, urine           Cutoff 200 ng/mL 1000 Methadone, urine                Cutoff 300 ng/mL 1100 1200 The urine drug screen provides only a preliminary, unconfirmed 1300 analytical test result and should not be used for non-medical 1400 purposes. Clinical consideration and professional judgment should 1500 be applied to any positive drug screen result due to possible 1600 interfering substances. A more specific alternate chemical method 1700 must be used in order to obtain a confirmed analytical result.  1800 Gas chromato graphy / mass spectrometry (GC/MS) is the preferred 1900 confirmatory method.   Urinalysis, Complete w Microscopic     Status: Abnormal   Collection Time: 08/05/16 12:33 AM  Result Value Ref Range   Color, Urine STRAW (A) YELLOW   APPearance CLEAR (A) CLEAR   Specific Gravity, Urine 1.005 1.005 - 1.030  pH 8.0 5.0 - 8.0   Glucose, UA NEGATIVE NEGATIVE mg/dL   Hgb urine dipstick NEGATIVE NEGATIVE   Bilirubin Urine NEGATIVE NEGATIVE   Ketones, ur 5 (A) NEGATIVE mg/dL   Protein, ur NEGATIVE NEGATIVE mg/dL   Nitrite NEGATIVE NEGATIVE   Leukocytes, UA NEGATIVE NEGATIVE   RBC / HPF NONE SEEN 0 - 5 RBC/hpf   WBC, UA NONE SEEN 0 - 5 WBC/hpf   Bacteria, UA NONE SEEN NONE SEEN   Squamous Epithelial / LPF 0-5 (A) NONE SEEN  Pregnancy, urine POC     Status: None   Collection Time: 08/05/16 12:40 AM  Result Value Ref Range   Preg Test, Ur NEGATIVE NEGATIVE    Comment:        THE SENSITIVITY OF THIS METHODOLOGY IS >24 mIU/mL     Current Facility-Administered  Medications  Medication Dose Route Frequency Provider Last Rate Last Dose  . QUEtiapine (SEROQUEL) tablet 50 mg  50 mg Oral BID Gonzella Lex, MD       Current Outpatient Prescriptions  Medication Sig Dispense Refill  . diclofenac sodium (VOLTAREN) 1 % GEL Apply 2 g topically 4 (four) times daily. For arthritic pain    . lamoTRIgine (LAMICTAL) 25 MG tablet Take 1 tablet (25 mg total) by mouth daily at 8 pm. For mood stabilization 30 tablet 0  . loratadine (CLARITIN) 10 MG tablet Take 1 tablet (10 mg total) by mouth daily. (May purchase this medicine from over the counter at the pharmacy): For allergies 30 tablet 0  . Multiple Vitamin (MULTIVITAMIN WITH MINERALS) TABS tablet Take 1 tablet by mouth daily. For low Vitamin    . QUEtiapine (SEROQUEL) 50 MG tablet Take 1 tablet (50 mg total) by mouth at bedtime. For mood control 30 tablet 0    Musculoskeletal: Strength & Muscle Tone: within normal limits Gait & Station: normal Patient leans: N/A  Psychiatric Specialty Exam: Physical Exam  Nursing note reviewed. Constitutional: She appears well-developed and well-nourished.  HENT:  Head: Normocephalic and atraumatic.  Eyes: Conjunctivae are normal. Pupils are equal, round, and reactive to light.  Neck: Normal range of motion.  Cardiovascular: Regular rhythm and normal heart sounds.   Respiratory: Effort normal. No respiratory distress.  GI: Soft.  Musculoskeletal: Normal range of motion.  Neurological: She is alert.  Skin: Skin is warm and dry.  Psychiatric: Her mood appears anxious. Her affect is labile and inappropriate. Her speech is tangential. She is agitated. Thought content is paranoid. She expresses impulsivity. She expresses no homicidal and no suicidal ideation. She exhibits abnormal recent memory.    Review of Systems  Constitutional: Negative.   HENT: Negative.   Eyes: Negative.   Respiratory: Negative.   Cardiovascular: Negative.   Gastrointestinal: Negative.    Musculoskeletal: Negative.   Skin: Negative.   Neurological: Negative.   Psychiatric/Behavioral: Positive for hallucinations and substance abuse. Negative for depression and suicidal ideas. The patient is nervous/anxious and has insomnia.     Blood pressure 115/73, pulse 88, temperature 98.6 F (37 C), temperature source Oral, resp. rate 20, height 5' 6"  (1.676 m), weight 63.5 kg (140 lb), SpO2 100 %.Body mass index is 22.6 kg/m.  General Appearance: Disheveled  Eye Contact:  Minimal  Speech:  Garbled  Volume:  Decreased  Mood:  Anxious, Dysphoric and Irritable  Affect:  Labile  Thought Process:  Disorganized  Orientation:  Negative  Thought Content:  Illogical  Suicidal Thoughts:  No  Homicidal Thoughts:  No  Memory:  Immediate;   Fair Recent;   Fair Remote;   Fair  Judgement:  Impaired  Insight:  Shallow  Psychomotor Activity:  Restlessness  Concentration:  Concentration: Poor  Recall:  Poor  Fund of Knowledge:  Fair  Language:  Fair  Akathisia:  No  Handed:  Right  AIMS (if indicated):     Assets:  Physical Health  ADL's:  Impaired  Cognition:  Impaired,  Mild  Sleep:        Treatment Plan Summary: Daily contact with patient to assess and evaluate symptoms and progress in treatment, Medication management and Plan 26 year old woman who appears to probably have bipolar disorder off of medicine. Probably having psychotic symptoms acting bizarrely poor self-care. A pulled commitment and admit patient to psychiatric unit. Restart Seroquel 50 mg twice a day. Full set of labs. Case reviewed with emergency room physician and TTS.  Disposition: Recommend psychiatric Inpatient admission when medically cleared. Supportive therapy provided about ongoing stressors.  Alethia Berthold, MD 08/05/2016 3:59 PM

## 2016-08-05 NOTE — ED Notes (Signed)
Pt ambulated to toilet with steady gait with this RN.

## 2016-08-05 NOTE — ED Notes (Signed)
Talked on phone with sister and boyfriend
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
## 2016-08-05 NOTE — ED Notes (Signed)

## 2016-08-05 NOTE — ED Notes (Signed)
Patient refusing to provide urine sample at this time.
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
## 2016-08-05 NOTE — Tx Team (Addendum)
Initial Treatment Plan 08/05/2016 10:40 PM Laurie Stevens [id number]    PATIENT STRESSORS: Financial difficulties Medication change or noncompliance Substance abuse   PATIENT STRENGTHS: Ability for insight Average or above average intelligence Motivation for treatment/growth Supportive family/friends   PATIENT IDENTIFIED PROBLEMS: Mood Instability  Substance Abuse -- UDS + For THC  PTSD                 DISCHARGE CRITERIA:  Improved stabilization in mood, thinking, and/or behavior Motivation to continue treatment in a less acute level of care Reduction of life-threatening or endangering symptoms to within safe limits Verbal commitment to aftercare and medication compliance  PRELIMINARY DISCHARGE PLAN: Outpatient therapy Return to previous living arrangement  PATIENT/FAMILY INVOLVEMENT: This treatment plan has been presented to and reviewed with the patient, Laurie Stevens.  The has been given the opportunity to ask questions and make suggestions.  Cleotis NipperAbiodun T Brita Jurgensen, RN 08/05/2016, 10:40 PM

## 2016-08-05 NOTE — ED Notes (Signed)
BEHAVIORAL HEALTH ROUNDING Patient sleeping: No. Patient alert and oriented: yes Behavior appropriate: Yes.  ; If no, describe:  Nutrition and fluids offered: yes Toileting and hygiene offered: Yes  Sitter present: q15 minute observations and security  monitoring Law enforcement present: Yes  ODS  

## 2016-08-05 NOTE — Progress Notes (Signed)
Patient searched for contraband none found. Skin checked with another nurse.  small bruise right upper arm and left lower leg. Skin warm dry and intact.

## 2016-08-05 NOTE — ED Notes (Signed)
Pt is alert and oriented this evening. Pt mood is anxious/sad. Pt denies SI/HI and AVH. Writer discussed tx plan and pt understands she will be admitted downstairs. Report called to BMU.

## 2016-08-05 NOTE — ED Notes (Signed)
BEHAVIORAL HEALTH ROUNDING Patient sleeping: Yes.   Patient alert and oriented: eyes closed  Appears to be asleep Behavior appropriate: Yes.  ; If no, describe:  Nutrition and fluids offered: Yes  Toileting and hygiene offered: sleeping Sitter present: q 15 minute observations and security monitoring Law enforcement present: yes  ODS 

## 2016-08-05 NOTE — ED Notes (Addendum)
Assessment completed  No am meds ordered at this time phone provided  - pt states  "I need to call some damn body but you have my phone and I do not know the number - go get my cell phone so that I can call someone."  Pt informed that once her belongings are stored and secured we are not allowed to go through them   She reports understanding

## 2016-08-05 NOTE — ED Notes (Signed)
Pt transferring to behavioral holding unit now  Report given to Starbucks Corporationuthie RN
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
## 2016-08-05 NOTE — ED Notes (Signed)
Patient observed lying in bed with eyes closed  Even, unlabored respirations observed   NAD pt appears to be sleeping  I will continue to monitor along with every 15 minute visual observations and ongoing security monitoring    

## 2016-08-05 NOTE — ED Notes (Signed)
ED BHU PLACEMENT JUSTIFICATION Is the patient under IVC or is there intent for IVC: Yes.   Is the patient medically cleared: Yes.   Is there vacancy in the ED BHU: Yes.   Is the population mix appropriate for patient: Yes.   Is the patient awaiting placement in inpatient or outpatient setting: Yes.   Has the patient had a psychiatric consult: consult pending Survey of unit performed for contraband, proper placement and condition of furniture, tampering with fixtures in bathroom, shower, and each patient room: Yes.  ; Findings:  APPEARANCE/BEHAVIOR Calm and cooperative NEURO ASSESSMENT Orientation: oriented x4  Denies pain Hallucinations: No.None noted (Hallucinations) Speech: Normal Gait: normal RESPIRATORY ASSESSMENT Even  Unlabored respirations  CARDIOVASCULAR ASSESSMENT Pulses equal   regular rate  Skin warm and dry   GASTROINTESTINAL ASSESSMENT no GI complaint EXTREMITIES Full ROM  PLAN OF CARE Provide calm/safe environment. Vital signs assessed twice daily. ED BHU Assessment once each 12-hour shift. Collaborate with TTS daily or as condition indicates. Assure the ED provider has rounded once each shift. Provide and encourage hygiene. Provide redirection as needed. Assess for escalating behavior; address immediately and inform ED provider.  Assess family dynamic and appropriateness for visitation as needed: Yes.  ; If necessary, describe findings:  Educate the patient/family about BHU procedures/visitation: Yes.  ; If necessary, describe findings:

## 2016-08-06 DIAGNOSIS — F3160 Bipolar disorder, current episode mixed, unspecified: Principal | ICD-10-CM

## 2016-08-06 LAB — LIPID PANEL
Cholesterol: 125 mg/dL (ref 0–200)
HDL: 42 mg/dL (ref >40)
LDL CALC: 70 mg/dL (ref 0–99)
Total CHOL/HDL Ratio: 3 RATIO
Triglycerides: 66 mg/dL (ref <150)
VLDL: 13 mg/dL (ref 0–40)

## 2016-08-06 LAB — TSH: TSH: 2.236 u[IU]/mL (ref 0.350–4.500)

## 2016-08-06 MED ORDER — WHITE PETROLATUM GEL
Status: DC | PRN
  Filled 2016-08-06: qty 28.35

## 2016-08-06 MED ORDER — LAMOTRIGINE 25 MG PO TABS
25.0000 mg | ORAL_TABLET | Freq: Every day | ORAL | Status: DC
  Administered 2016-08-06 – 2016-08-09 (×4): 25 mg via ORAL
  Filled 2016-08-06 (×4): qty 1

## 2016-08-06 MED ORDER — QUETIAPINE FUMARATE 100 MG PO TABS
100.0000 mg | ORAL_TABLET | Freq: Every day | ORAL | Status: DC
  Administered 2016-08-06 – 2016-08-08 (×3): 100 mg via ORAL
  Filled 2016-08-06 (×3): qty 1

## 2016-08-06 NOTE — Plan of Care (Signed)
Problem: Activity: Goal: Sleeping patterns will improve Outcome: Progressing Patient slept for Estimated Hours of 5; every 15 minutes safety rounds maintained, no injury or falls during this shift.

## 2016-08-06 NOTE — Progress Notes (Signed)
Recreation Therapy Notes  Date: 01.18.18 Time: 9:30 am Location: Craft Room  Group Topic: Leisure Education  Goal Area(s) Addresses:  Patient will identify things they are grateful for. Patient will identify how being grateful can influence decision making.  Behavioral Response: Attentive, Interactive  Intervention: Grateful Wheel  Activity: Patients were given an I Am Grateful For worksheet and were instructed to write things they are grateful for under each category.  Education: LRT educated patients on leisure and why it is important.  Education Outcome: Acknowledges education/In group clarification offered  Clinical Observations/Feedback: Patient wrote things she is grateful for. Patient contributed to group discussion by stating things she is grateful for, that she does participate in leisure activities, and how being aware of what she is grateful for affects her decision making skills.  Jacquelynn CreeGreene,Kyrene Longan M, LRT/CTRS 08/06/2016 10:25 AM

## 2016-08-06 NOTE — Progress Notes (Addendum)
Patient with sad affect, cooperative behavior with meals, meds and plan of care. No SI/HI at this time. Orient to unit rt recent admit. Patient tired and resting in bed during free time rt recent admit. Patient states "someone told me I may go home today". Brief review of plan of care, patient to meet with MD. Patient wearing scrubs, c/o "it is cold here in hospital". Writer to locker and washes patient's pink and white jacket for patient to wear on unit. Patient takes am dose of Seroquel 50 mg. Safety maintained. Patient teary after speaking with MD. Patient understands that for Bipolar disorder she should take meds as recommended by MD. States that since she is [age] she does not have health insurance coverage to buy her meds. Patient also upset MD recommended she does not smoke marijuana. Patient believes she should move to [place] so that she can smoke marijuana recreationally.

## 2016-08-06 NOTE — H&P (Signed)
Psychiatric Admission Assessment Adult  Patient Identification: Laurie Stevens MRN:  [id number] Date of Evaluation:  08/06/2016 Chief Complaint:  bipolar disorder Principal Diagnosis: <principal problem not specified> Diagnosis:   Patient Active Problem List   Diagnosis Date Noted  . Bipolar disorder, mixed ([place]) [F31.60] 08/05/2016  . Bipolar disorder, curr episode mixed, severe, with psychotic features ([place]) [F31.64] 03/05/2016  . Cannabis use disorder, mild, abuse [F12.10] 03/05/2016  . Abdominal bloating [R14.0] 12/02/2010  . Weight gain [R63.5] 12/02/2010  . Family history of celiac disease [Z83.79] 12/02/2010  . EDEMA [R60.9] 01/17/2008  . CHEST PAIN [R07.9] 01/17/2008  . 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 Patient was agitated and confused and appeared psychotic in the emergency room. Patient admits that she has not been taking any psychiatric medicine for months. She says she had not slept for several days. Admits to heavy use of marijuana daily. Patient was aware that her mood was anxious and feeling stressed out. She denies being aware of any hallucinations. Denies suicidal or homicidal ideation. Associated Signs/Symptoms: Depression Symptoms:  insomnia, difficulty concentrating, impaired memory, anxiety, (Hypo) Manic Symptoms:  Distractibility, Impulsivity, Irritable Mood, Anxiety Symptoms:  Excessive Worry, Psychotic Symptoms:  Confusion and bizarre thinking with some elements of paranoia PTSD Symptoms: Negative Total Time spent with patient: 1 hour  Past Psychiatric History: Patient has [facility] and was diagnosed with bipolar disorder. She was discharged on a low dose of Seroquel and lamotrigine. Patient did not follow-up with outpatient treatment. Patient evidently  has had problems with mood instability and chaotic behavior for some time. Patient also abuses large amounts of marijuana daily but insists that this could not possibly be a problem and has very poor insight. Patient does not have a history of suicide attempts identified  Is the patient at risk to self? Yes.    Has the patient been a risk to self in the past 6 months? Yes.    Has the patient been a risk to self within the distant past? No.  Is the patient a risk to others? No.  Has the patient been a risk to others in the past 6 months? No.  Has the patient been a risk to others within the distant past? No.   Prior Inpatient Therapy:   Prior Outpatient Therapy:    Alcohol Screening: 1. How often do you have a drink containing alcohol?: 2 to 4 times a month 2. How many drinks containing alcohol do you have on a typical day when you are drinking?: 3 or 4 3. How often do you have six or more drinks on one occasion?: Never Preliminary Score: 1 4. How often during the last year have you found that you were not able to stop drinking once you had started?: Never 5. How often during the last year have you failed to do what was normally expected from you becasue of drinking?: Never 6. How often during the last year have you needed a first drink in the morning to get yourself going after a heavy drinking session?: Never 7. How often during the last year have you had a feeling of guilt of remorse after drinking?: Never 8. How often during the last year have you been unable to remember what happened the night before because you had been drinking?: Never 9. Have you or  someone else been injured as a result of your drinking?: No 10. Has a relative or friend or a doctor or another health worker been concerned about your drinking or suggested you cut down?: No Alcohol Use Disorder Identification Test Final Score (AUDIT): 3 Brief Intervention: AUDIT score less than 7 or less-screening does not suggest  unhealthy drinking-brief intervention not indicated Substance Abuse History in the last 12 months:  Yes.   Consequences of Substance Abuse: Significant impairment and worsening of her mental health issues Previous Psychotropic Medications: Yes  Psychological Evaluations: Yes  Past Medical History:  Past Medical History:  Diagnosis Date  . HSP (Henoch Schonlein purpura) ([place]) 05/99   hs no renal involvement hosp with abd pain [place] rx  with prednisone  . Pneumonia 05/04   RML   History reviewed. No pertinent surgical history. Family History:  Family History  Problem Relation Age of Onset  . Celiac disease Sister   . Thyroid disease Sister   . Thyroid disease Sister     autoimmune disease  . Diabetes type II    . Hypertension    . Hypertension Maternal Grandmother   . Cancer Maternal Grandmother   . Cancer - Cervical Maternal Grandmother   . Suicidality Maternal Grandfather   . Alcoholism Maternal Uncle    Family Psychiatric  History: Patient denies any family history of mental illness Tobacco Screening: Have you used any form of tobacco in the last 30 days? (Cigarettes, Smokeless Tobacco, Cigars, and/or Pipes): No Social History:  History  Alcohol Use No    Comment: rarely     History  Drug Use  . Types: Marijuana    Comment: Marijuana    Additional Social History:                           Allergies:   Allergies  Allergen Reactions  . Lactose Intolerance (Gi) Diarrhea   Lab Results:  Results for orders placed or performed during the hospital encounter of 08/05/16 (from the past 48 hour(s))  Lipid panel     Status: None   Collection Time: 08/06/16  6:35 AM  Result Value Ref Range   Cholesterol 125 0 - 200 mg/dL   Triglycerides 66 <150 mg/dL   HDL 42 >40 mg/dL   Total CHOL/HDL Ratio 3.0 RATIO   VLDL 13 0 - 40 mg/dL   LDL Cholesterol 70 0 - 99 mg/dL    Comment:        Total Cholesterol/HDL:CHD Risk Coronary Heart Disease Risk Table                      Men   Women  1/2 Average Risk   3.4   3.3  Average Risk       5.0   4.4  2 X Average Risk   9.6   7.1  3 X Average Risk  23.4   11.0        Use the calculated Patient Ratio above and the CHD Risk Table to determine the patient's CHD Risk.        ATP III CLASSIFICATION (LDL):  <100     mg/dL   Optimal  100-129  mg/dL   Near or Above                    Optimal  130-159  mg/dL   Borderline  160-189  mg/dL   High  >190  mg/dL   Very High   TSH     Status: None   Collection Time: 08/06/16  6:35 AM  Result Value Ref Range   TSH 2.236 0.350 - 4.500 uIU/mL    Comment: Performed by a 3rd Generation assay with a functional sensitivity of <=0.01 uIU/mL.    Blood Alcohol level:  Lab Results  Component Value Date   [facility] <5 08/05/2016   ETH <5 14/48/1856    Metabolic Disorder Labs:  Lab Results  Component Value Date   HGBA1C 4.9 03/06/2016   MPG 94 03/06/2016   Lab Results  Component Value Date   PROLACTIN 75.6 (H) 03/06/2016   Lab Results  Component Value Date   CHOL 125 08/06/2016   TRIG 66 08/06/2016   HDL 42 08/06/2016   CHOLHDL 3.0 08/06/2016   VLDL 13 08/06/2016   LDLCALC 70 08/06/2016   LDLCALC 75 03/06/2016    Current Medications: Current Facility-Administered Medications  Medication Dose Route Frequency Provider Last Rate Last Dose  . acetaminophen (TYLENOL) tablet 650 mg  650 mg Oral Q6H PRN Gonzella Lex, MD      . alum & mag hydroxide-simeth (MAALOX/MYLANTA) 200-200-20 MG/5ML suspension 30 mL  30 mL Oral Q4H PRN Gonzella Lex, MD      . lamoTRIgine (LAMICTAL) tablet 25 mg  25 mg Oral Daily Gonzella Lex, MD   25 mg at 08/06/16 1537  . magnesium hydroxide (MILK OF MAGNESIA) suspension 30 mL  30 mL Oral Daily PRN Gonzella Lex, MD      . QUEtiapine (SEROQUEL) tablet 100 mg  100 mg Oral QHS Gonzella Lex, MD       PTA Medications: Prescriptions Prior to Admission  Medication Sig Dispense Refill Last Dose  . loratadine (CLARITIN) 10 MG tablet Take 1  tablet (10 mg total) by mouth daily. (May purchase this medicine from over the counter at the pharmacy): For allergies 30 tablet 0 Past Week at Unknown time  . diclofenac sodium (VOLTAREN) 1 % GEL Apply 2 g topically 4 (four) times daily. For arthritic pain   unknown at Unknown time  . lamoTRIgine (LAMICTAL) 25 MG tablet Take 1 tablet (25 mg total) by mouth daily at 8 pm. For mood stabilization 30 tablet 0 06/04/2016 at unknown  . Multiple Vitamin (MULTIVITAMIN WITH MINERALS) TABS tablet Take 1 tablet by mouth daily. For low Vitamin     . QUEtiapine (SEROQUEL) 50 MG tablet Take 1 tablet (50 mg total) by mouth at bedtime. For mood control 30 tablet 0     Musculoskeletal: Strength & Muscle Tone: within normal limits Gait & Station: normal Patient leans: N/A  Psychiatric Specialty Exam: Physical Exam  Nursing note and vitals reviewed. Constitutional: She appears well-developed and well-nourished.  HENT:  Head: Normocephalic and atraumatic.  Eyes: Conjunctivae are normal. Pupils are equal, round, and reactive to light.  Neck: Normal range of motion.  Cardiovascular: Regular rhythm and normal heart sounds.   Respiratory: Effort normal. No respiratory distress.  GI: Soft.  Musculoskeletal: Normal range of motion.  Neurological: She is alert.  Skin: Skin is warm and dry.  Psychiatric: Her affect is blunt and inappropriate. Her speech is delayed and tangential. She is withdrawn. She expresses impulsivity. She expresses no homicidal and no suicidal ideation. She exhibits abnormal recent memory.    Review of Systems  Constitutional: Negative.   HENT: Negative.   Eyes: Negative.   Respiratory: Negative.   Cardiovascular: Negative.   Gastrointestinal: Negative.  Musculoskeletal: Negative.   Skin: Negative.   Neurological: Negative.   Psychiatric/Behavioral: Positive for memory loss and substance abuse. Negative for depression, hallucinations and suicidal ideas. The patient has insomnia.  The patient is not nervous/anxious.     Blood pressure 124/90, pulse (!) 56, temperature 97.9 F (36.6 C), temperature source Oral, resp. rate 18, height 5' 6"  (1.676 m), weight 63.5 kg (139 lb 15.9 oz), SpO2 100 %.Body mass index is 22.6 kg/m.  General Appearance: Disheveled  Eye Contact:  Minimal  Speech:  Garbled and Slow  Volume:  Decreased  Mood:  Anxious and Dysphoric  Affect:  Constricted and Inappropriate  Thought Process:  Disorganized  Orientation:  Full (Time, Place, and Person)  Thought Content:  Illogical and Tangential  Suicidal Thoughts:  No  Homicidal Thoughts:  No  Memory:  Immediate;   Fair Recent;   Fair Remote;   Fair  Judgement:  Impaired  Insight:  Shallow  Psychomotor Activity:  Decreased  Concentration:  Concentration: Fair  Recall:  [employer] of Knowledge:  Fair  Language:  Fair  Akathisia:  No  Handed:  Right  AIMS (if indicated):     Assets:  Desire for Improvement Housing Physical Health Resilience  ADL's:  Impaired  Cognition:  Impaired,  Mild  Sleep:  Number of Hours: 5    Treatment Plan Summary: Daily contact with patient to assess and evaluate symptoms and progress in treatment, Medication management and Plan Patient has been started on Seroquel and lamotrigine. I am altering the dose of Seroquel to make 100 mg at night. Lamotrigine starting out at 25 mg a day. Patient will be engaged in groups on the unit and will have ongoing evaluation by the full team and staff to observe for mental health changes. We will try and improve her insight about her marijuana abuse. Work on discharge planning for outpatient treatment at discharge.  Observation Level/Precautions:  15 minute checks  Laboratory:  UDS  Psychotherapy:    Medications:    Consultations:    Discharge Concerns:    Estimated LOS:  Other:     Physician Treatment Plan for Primary Diagnosis: <principal problem not specified> Long Term Goal(s): Improvement in symptoms so as ready  for discharge  Short Term Goals: Ability to disclose and discuss suicidal ideas and Ability to demonstrate self-control will improve  Physician Treatment Plan for Secondary Diagnosis: Active Problems:   Bipolar disorder, mixed ([place])  Long Term Goal(s): Improvement in symptoms so as ready for discharge  Short Term Goals: Ability to demonstrate self-control will improve and Ability to identify and develop effective coping behaviors will improve  I certify that inpatient services furnished can reasonably be expected to improve the patient's condition.    Alethia Berthold, MD 1/18/20185:17 PM

## 2016-08-06 NOTE — [facility] Group Notes (Signed)
[facility] Group Notes:  (Nursing/MHT/Case Management/Adjunct)  Date:  08/06/2016  Time:  6:24 PM  Type of Therapy:  Psychoeducational Skills  Participation Level:  Active  Participation Quality:  Appropriate  Affect:  Appropriate  Cognitive:  Appropriate  Insight:  Appropriate  Engagement in Group:  Engaged  Modes of Intervention:  Activity  Summary of Progress/Problems:  Mickey Farberamela M Moses Ellison 08/06/2016, 6:24 PM

## 2016-08-06 NOTE — [facility] Suicide Risk Assessment (Signed)
[facility] Admission Suicide Risk Assessment   Nursing information obtained from:   conversation with current nursing on duty as well as in the emergency room and review of chart Demographic factors:   patient with minimal financial support who has been resistant to outpatient psychiatric treatment Current Mental Status:   no longer grossly confused or agitated. Insight impaired. Denies suicidal thoughts. Loss Factors:   ongoing financial and social impairment Historical Factors:   history of psychiatric hospitalization for bipolar disorder Risk Reduction Factors:   denies suicidal thoughts and has positive plans for the future  Total Time spent with patient: 1 hour Principal Problem: Bipolar disorder 1 mixed Diagnosis:   Patient Active Problem List   Diagnosis Date Noted  . Bipolar disorder, mixed ([place]) [F31.60] 08/05/2016  . Bipolar disorder, curr episode mixed, severe, with psychotic features ([place]) [F31.64] 03/05/2016  . Cannabis use disorder, mild, abuse [F12.10] 03/05/2016  . Abdominal bloating [R14.0] 12/02/2010  . Weight gain [R63.5] 12/02/2010  . Family history of celiac disease [Z83.79] 12/02/2010  . EDEMA [R60.9] 01/17/2008  . CHEST PAIN [R07.9] 01/17/2008  . 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 Patient denies any suicidal thoughts at all. Denies homicidal thoughts. Denies any current psychotic symptoms. She is able to talk about how she has not been sleeping recently or been on psychiatric medicine. Her insight is poor as to the need for outpatient treatment. She has been cooperative however with treatment here in the [facility] at record.  Continued Clinical Symptoms:  Alcohol Use Disorder Identification Test Final Score (AUDIT): 3 The "Alcohol Use Disorders Identification Test", Guidelines for Use in Primary Care, Second Edition.  World Pharmacologist [facility]). Score between 0-7:  no or low risk or alcohol related problems. Score between  8-15:  moderate risk of alcohol related problems. Score between 16-19:  high risk of alcohol related problems. Score 20 or above:  warrants further diagnostic evaluation for alcohol dependence and treatment.   CLINICAL FACTORS:   Bipolar Disorder:   Mixed State   Musculoskeletal: Strength & Muscle Tone: within normal limits Gait & Station: normal Patient leans: N/A  Psychiatric Specialty Exam: Physical Exam  Review of Systems  Constitutional: Negative.   HENT: Negative.   Eyes: Negative.   Respiratory: Negative.   Cardiovascular: Negative.   Gastrointestinal: Negative.   Musculoskeletal: Negative.   Skin: Negative.   Neurological: Negative.   Psychiatric/Behavioral: Positive for memory loss and substance abuse. Negative for depression, hallucinations and suicidal ideas. The patient is nervous/anxious and has insomnia.     Blood pressure 124/90, pulse (!) 56, temperature 97.9 F (36.6 C), temperature source Oral, resp. rate 18, height 5' 6"  (1.676 m), weight 63.5 kg (139 lb 15.9 oz), SpO2 100 %.Body mass index is 22.6 kg/m.  General Appearance: Disheveled  Eye Contact:  Minimal  Speech:  Slow  Volume:  Decreased  Mood:  Dysphoric  Affect:  Constricted  Thought Process:  Disorganized  Orientation:  Full (Time, Place, and Person)  Thought Content:  Illogical  Suicidal Thoughts:  No  Homicidal Thoughts:  No  Memory:  Immediate;   Fair Recent;   Fair Remote;   Fair  Judgement:  Impaired  Insight:  Shallow  Psychomotor Activity:  Decreased  Concentration:  Concentration: Fair  Recall:  [employer] of Knowledge:  Fair  Language:  Fair  Akathisia:  No  Handed:  Right  AIMS (if indicated):     Assets:  Desire for [place]  ADL's:  Intact  Cognition:  WNL  Sleep:  Number of Hours: 5      COGNITIVE FEATURES THAT CONTRIBUTE TO RISK:  Loss of executive function    SUICIDE RISK:   Mild:  Suicidal ideation of limited  frequency, intensity, duration, and specificity.  There are no identifiable plans, no associated intent, mild dysphoria and related symptoms, good self-control (both objective and subjective assessment), few other risk factors, and identifiable protective factors, including available and accessible social support.  PLAN OF CARE: Patient is being started on medicine for bipolar disorder. She will be engaged in individual and group therapy daily under will be an ongoing assessment of suicidality prior to discharge at which time she will be referred for outpatient treatment  I certify that inpatient services furnished can reasonably be expected to improve the patient's condition.   Alethia Berthold, MD 08/06/2016, 5:14 PM

## 2016-08-06 NOTE — [facility] Group Notes (Signed)
[facility] LCSW Group Therapy Note  Date/Time: 08/06/16, 1300  Type of Therapy/Topic:  Group Therapy:  Balance in Life  Participation Level:  moderate  Description of Group:    This group will address the concept of balance and how it feels and looks when one is unbalanced. Patients will be encouraged to process areas in their lives that are out of balance, and identify reasons for remaining unbalanced. Facilitators will guide patients utilizing problem- solving interventions to address and correct the stressor making their life unbalanced. Understanding and applying boundaries will be explored and addressed for obtaining  and maintaining a balanced life. Patients will be encouraged to explore ways to assertively make their unbalanced needs known to significant others in their lives, using other group members and facilitator for support and feedback.  Therapeutic Goals: 1. Patient will identify two or more emotions or situations they have that consume much of in their lives. 2. Patient will identify signs/triggers that life has become out of balance:  3. Patient will identify two ways to set boundaries in order to achieve balance in their lives:  4. Patient will demonstrate ability to communicate their needs through discussion and/or role plays  Summary of Patient Progress:  Pt shared that social aspects of her life feel out of balance as she always feels the need to stay in contact with friends/family and usually feels like she is failing at this.  She also shared that balance between work and finances is also an area of struggle.  She commented that being assertive has been difficult for her and family does not appreciate it when she sets boundaries and is assertive.     Therapeutic Modalities:   Cognitive Behavioral Therapy Solution-Focused Therapy Assertiveness Training  Daleen SquibbGreg Samiksha Pellicano, [place]

## 2016-08-07 LAB — HEMOGLOBIN A1C
HEMOGLOBIN A1C: 4.7 % — AB (ref 4.8–5.6)
Mean Plasma Glucose: 88 mg/dL

## 2016-08-07 LAB — PROLACTIN: Prolactin: 34.2 ng/mL — ABNORMAL HIGH (ref 4.8–23.3)

## 2016-08-07 MED ORDER — QUETIAPINE FUMARATE 25 MG PO TABS: 25.0000 mg | ORAL_TABLET | Freq: Four times a day (QID) | ORAL | Status: DC | PRN

## 2016-08-07 MED ORDER — QUETIAPINE FUMARATE 100 MG PO TABS: 100.0000 mg | ORAL_TABLET | Freq: Every day | ORAL | 0 refills | Status: DC

## 2016-08-07 MED ORDER — QUETIAPINE FUMARATE 25 MG PO TABS: 25.0000 mg | ORAL_TABLET | Freq: Four times a day (QID) | ORAL | 1 refills | Status: DC | PRN

## 2016-08-07 MED ORDER — LAMOTRIGINE 25 MG PO TABS: 25.0000 mg | ORAL_TABLET | Freq: Every day | ORAL | 0 refills | Status: DC

## 2016-08-07 NOTE — [facility] Suicide Risk Assessment (Signed)
[facility] INPATIENT:  Family/Significant Other Suicide Prevention Education  Suicide Prevention Education:  Contact Attempts: Sister, Lenis NoonShannon Stevens has been identified by the patient as the family member/significant other with whom the patient will be residing, and identified as the person(s) who will aid the patient in the event of a mental health crisis.  With written consent from the patient, two attempts were made to provide suicide prevention education, prior to and/or following the patient's discharge.  We were unsuccessful in providing suicide prevention education.  A suicide education pamphlet was given to the patient to share with family/significant other.  Date and time of first attempt: 08/07/2016 @ 2:46PM  Lynden OxfordKadijah R Harish Stevens, MSW, LCSW-A 08/07/2016, 2:48 PM

## 2016-08-07 NOTE — Progress Notes (Signed)
Patient is anxious and tearful.  Keeps repeating that she does not need to be here and is ready to go.  Support and encouragement offered.  Safety maintained.

## 2016-08-07 NOTE — [facility] Group Notes (Signed)
[facility] Group Notes:  (Nursing/MHT/Case Management/Adjunct)  Date:  08/07/2016  Time:  11:12 PM  Type of Therapy:  Group Therapy  Participation Level:  Active  Participation Quality:  Appropriate  Affect:  Appropriate  Cognitive:  Appropriate  Insight:  Appropriate  Engagement in Group:  Engaged  Modes of Intervention:  Activity  Summary of Progress/Problems:  Laurie Stevens 08/07/2016, 11:12 PM

## 2016-08-07 NOTE — Progress Notes (Signed)
[facility] MD Progress Note  08/07/2016 8:28 PM Laurie Stevens  MRN:  [id number] Subjective:  26 year old woman with a history of mood instability brought into the hospital in a labile agitated confused fashion. Has stabilized and shown great improvement so far. Principal Problem: Bipolar disorder, mixed ([place]) Diagnosis:   Patient Active Problem List   Diagnosis Date Noted  . Bipolar disorder, mixed ([place]) [F31.60] 08/05/2016  . Bipolar disorder, curr episode mixed, severe, with psychotic features ([place]) [F31.64] 03/05/2016  . Cannabis use disorder, mild, abuse [F12.10] 03/05/2016  . Abdominal bloating [R14.0] 12/02/2010  . Weight gain [R63.5] 12/02/2010  . Family history of celiac disease [Z83.79] 12/02/2010  . EDEMA [R60.9] 01/17/2008  . CHEST PAIN [R07.9] 01/17/2008  . 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 History of medicine noncompliance recently.  Past Medical History:  Past Medical History:  Diagnosis Date  . HSP (Henoch Schonlein purpura) ([place]) 05/99   hs no renal involvement hosp with abd pain [place] rx  with prednisone  . Pneumonia 05/04   RML   History reviewed. No pertinent surgical history. Family History:  Family History  Problem Relation Age of Onset  . Celiac disease Sister   . Thyroid disease Sister   . Thyroid disease Sister     autoimmune disease  . Diabetes type II    . Hypertension    . Hypertension Maternal Grandmother   . Cancer Maternal Grandmother   . Cancer - Cervical Maternal Grandmother   . Suicidality Maternal Grandfather   . Alcoholism Maternal Uncle    Family Psychiatric  History: Positive for bipolar disorder Social History:  History  Alcohol Use No    Comment: rarely     History  Drug Use  . Types: Marijuana    Comment: Marijuana    Social History    Social History  . Marital status: Single    Spouse name: N/A  . Number of children: N/A  . Years of education: N/A   Social History Main Topics  . Smoking status: Never Smoker  . Smokeless tobacco: Never Used  . Alcohol use No     Comment: rarely  . Drug use: Yes    Types: Marijuana     Comment: Marijuana  . Sexual activity: Not Currently   Other Topics Concern  . 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 acetaminophen (TYLENOL) tablet 650 mg  650 mg Oral Q6H PRN Gonzella Lex, MD      . alum & mag hydroxide-simeth (MAALOX/MYLANTA) 200-200-20 MG/5ML suspension 30 mL  30 mL Oral Q4H PRN Gonzella Lex, MD      . lamoTRIgine (LAMICTAL) tablet 25 mg  25 mg Oral Daily Leatta Alewine T  Fantasy Donald, MD   25 mg at 08/07/16 0820  . magnesium hydroxide (MILK OF MAGNESIA) suspension 30 mL  30 mL Oral Daily PRN Gonzella Lex, MD      . QUEtiapine (SEROQUEL) tablet 100 mg  100 mg Oral QHS Gonzella Lex, MD   100 mg at 08/06/16 2120  . QUEtiapine (SEROQUEL) tablet 25 mg  25 mg Oral QID PRN Gonzella Lex, MD      . white petrolatum (VASELINE) gel   Topical PRN Clovis Fredrickson, MD        Lab Results:  Results for orders placed or performed during the hospital encounter of 08/05/16 (from the past 48 hour(s))  Hemoglobin A1c     Status: Abnormal   Collection Time: 08/06/16  6:35 AM  Result Value Ref Range   Hgb A1c MFr Bld 4.7 (L) 4.8 - 5.6 %    Comment: (NOTE)         Pre-diabetes: 5.7 - 6.4         Diabetes: >6.4         Glycemic control  for adults with diabetes: <7.0    Mean Plasma Glucose 88 mg/dL    Comment: (NOTE) Performed At: [facility] [place], [place] [id number] Lindon Romp MD HQ:[id number]   Lipid panel     Status: None   Collection Time: 08/06/16  6:35 AM  Result Value Ref Range   Cholesterol 125 0 - 200 mg/dL   Triglycerides 66 <150 mg/dL   HDL 42 >40 mg/dL   Total CHOL/HDL Ratio 3.0 RATIO   VLDL 13 0 - 40 mg/dL   LDL Cholesterol 70 0 - 99 mg/dL    Comment:        Total Cholesterol/HDL:CHD Risk Coronary Heart Disease Risk Table                     Men   Women  1/2 Average Risk   3.4   3.3  Average Risk       5.0   4.4  2 X Average Risk   9.6   7.1  3 X Average Risk  23.4   11.0        Use the calculated Patient Ratio above and the CHD Risk Table to determine the patient's CHD Risk.        ATP III CLASSIFICATION (LDL):  <100     mg/dL   Optimal  100-129  mg/dL   Near or Above                    Optimal  130-159  mg/dL   Borderline  160-189  mg/dL   High  >190     mg/dL   Very High   Prolactin     Status: Abnormal   Collection Time: 08/06/16  6:35 AM  Result Value Ref Range   Prolactin 34.2 (H) 4.8 - 23.3 ng/mL    Comment: (NOTE) Performed At: [facility] [place], [place] [id number] Lindon Romp MD VO:[id number]   TSH     Status: None   Collection Time: 08/06/16  6:35 AM  Result Value Ref Range   TSH 2.236 0.350 - 4.500 uIU/mL    Comment: Performed by a 3rd Generation assay with a functional sensitivity of <=0.01 uIU/mL.    Blood Alcohol level:  Lab Results  Component Value Date   ETH <5 08/05/2016   ETH <5 03/03/2016  Metabolic Disorder Labs: Lab Results  Component Value Date   HGBA1C 4.7 (L) 08/06/2016   MPG 88 08/06/2016   MPG 94 03/06/2016   Lab Results  Component Value Date   PROLACTIN 34.2 (H) 08/06/2016   PROLACTIN 75.6 (H) 03/06/2016   Lab Results  Component Value Date   CHOL 125 08/06/2016   TRIG  66 08/06/2016   HDL 42 08/06/2016   CHOLHDL 3.0 08/06/2016   VLDL 13 08/06/2016   LDLCALC 70 08/06/2016   LDLCALC 75 03/06/2016    Physical Findings: AIMS:  , ,  ,  ,    CIWA:    COWS:     Musculoskeletal: Strength & Muscle Tone: within normal limits Gait & Station: normal Patient leans: N/A  Psychiatric Specialty Exam: Physical Exam  Nursing note and vitals reviewed. Constitutional: She appears well-developed and well-nourished.  HENT:  Head: Normocephalic and atraumatic.  Eyes: Conjunctivae are normal. Pupils are equal, round, and reactive to light.  Neck: Normal range of motion.  Cardiovascular: Regular rhythm and normal heart sounds.   Respiratory: Effort normal. No respiratory distress.  GI: Soft.  Musculoskeletal: Normal range of motion.  Neurological: She is alert.  Skin: Skin is warm and dry.  Psychiatric: Her speech is normal and behavior is normal. Judgment and thought content normal. Her mood appears anxious. Cognition and memory are normal.    Review of Systems  Constitutional: Negative.   HENT: Negative.   Eyes: Negative.   Respiratory: Negative.   Cardiovascular: Negative.   Gastrointestinal: Negative.   Musculoskeletal: Negative.   Skin: Negative.   Neurological: Negative.   Psychiatric/Behavioral: Negative for depression, hallucinations, memory loss, substance abuse and suicidal ideas. The patient is nervous/anxious. The patient does not have insomnia.     Blood pressure 110/69, pulse 61, temperature 98.4 F (36.9 C), temperature source Oral, resp. rate 18, height 5' 6"  (1.676 m), weight 63.5 kg (139 lb 15.9 oz), SpO2 100 %.Body mass index is 22.6 kg/m.  General Appearance: Disheveled  Eye Contact:  Fair  Speech:  Normal Rate  Volume:  Decreased  Mood:  Anxious  Affect:  Appropriate  Thought Process:  Coherent  Orientation:  Full (Time, Place, and Person)  Thought Content:  Logical  Suicidal Thoughts:  No  Homicidal Thoughts:  No  Memory:   Immediate;   Good Recent;   Fair Remote;   Good  Judgement:  Fair  Insight:  Fair  Psychomotor Activity:  Decreased  Concentration:  Concentration: Fair  Recall:  [employer] of Knowledge:  Fair  Language:  Fair  Akathisia:  No  Handed:  Right  AIMS (if indicated):     Assets:  Chief Executive Officer Physical Health Social Support  ADL's:  Intact  Cognition:  WNL  Sleep:  Number of Hours: 6.15     Treatment Plan Summary: Daily contact with patient to assess and evaluate symptoms and progress in treatment, Medication management and Plan Patient is taking a modest dose of Seroquel and showing good response and tolerance. Her affect is becoming more stable. We are tentatively planning for likely discharge on Sunday. Follow-up with local mental health and her community per social work  Alethia Berthold, MD 08/07/2016, 8:28 PM

## 2016-08-07 NOTE — BHH Group Notes (Signed)
Goals Group: late entry Date/Time: 08/06/17 9:00 AM Type of Therapy and Topic: Group Therapy: Goals Group: SMART Goals   Participation Level: Moderate  Description of Group:    The purpose of a daily goals group is to assist and guide patients in setting recovery/wellness-related goals. The objective is to set goals as they relate to the crisis in which they were admitted. Patients will be using SMART goal modalities to set measurable goals. Characteristics of realistic goals will be discussed and patients will be assisted in setting and processing how one will reach their goal. Facilitator will also assist patients in applying interventions and coping skills learned in psycho-education groups to the SMART goal and process how one will achieve defined goal.   Therapeutic Goals:   -Patients will develop and document one goal related to or their crisis in which brought them into treatment.  -Patients will be guided by LCSW using SMART goal setting modality in how to set a measurable, attainable, realistic and time sensitive goal.  -Patients will process barriers in reaching goal.  -Patients will process interventions in how to overcome and successful in reaching goal.   Patient's Goal: To get more clarity about what I need to do in order to be discharged and get home.     Therapeutic Modalities:  Motivational Interviewing  Research officer, political partyCognitive Behavioral Therapy  Crisis Intervention Model  SMART goals setting   Daleen SquibbGreg Waver Dibiasio, KentuckyLCSW

## 2016-08-07 NOTE — BHH Suicide Risk Assessment (Signed)
[facility] Discharge Suicide Risk Assessment   Principal Problem: Bipolar disorder, mixed [facility]) Discharge Diagnoses:  Patient Active Problem List   Diagnosis Date Noted  . Bipolar disorder, mixed ([place]) [F31.60] 08/05/2016  . Bipolar disorder, curr episode mixed, severe, with psychotic features ([place]) [F31.64] 03/05/2016  . Cannabis use disorder, mild, abuse [F12.10] 03/05/2016  . Abdominal bloating [R14.0] 12/02/2010  . Weight gain [R63.5] 12/02/2010  . Family history of celiac disease [Z83.79] 12/02/2010  . EDEMA [R60.9] 01/17/2008  . CHEST PAIN [R07.9] 01/17/2008  . 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 rate 18, height 5' 6"  (1.676 m), weight 63.5 kg (139 lb 15.9 oz), SpO2 100 %.Body mass index is 22.6 kg/m.  General Appearance: Fairly Groomed  Engineer, water::  Good  Speech:  Clear and Coherent409  Volume:  Normal  Mood:  Euthymic  Affect:  Congruent  Thought Process:  Goal Directed  Orientation:  Full (Time, Place, and Person)  Thought Content:  Logical  Suicidal Thoughts:  No  Homicidal Thoughts:  No  Memory:  Immediate;   Good Recent;   Fair Remote;   Fair  Judgement:  Fair  Insight:  Fair  Psychomotor Activity:  Normal  Concentration:  Fair  Recall:  [employer] of Knowledge:Poor  Language: Fair  Akathisia:  No  Handed:  Right  AIMS (if indicated):     Assets:  Communication Skills Desire for Improvement Housing  Sleep:  Number of Hours: 6.15  Cognition: WNL  ADL's:  Intact   Mental Status Per Nursing Assessment::   On Admission:     Demographic Factors:  Living alone  Loss Factors: Financial problems/change in socioeconomic status  Historical Factors: Impulsivity  Risk  Reduction Factors:   Sense of responsibility to family, Positive social support and Positive therapeutic relationship  Continued Clinical Symptoms:  Bipolar Disorder:   Mixed State  Cognitive Features That Contribute To Risk:  Loss of executive function    Suicide Risk:  Mild:  Suicidal ideation of limited frequency, intensity, duration, and specificity.  There are no identifiable plans, no associated intent, mild dysphoria and related symptoms, good self-control (both objective and subjective assessment), few other risk factors, and identifiable protective factors, including available and accessible social support.    Plan Of Care/Follow-up recommendations:  Activity:  Activity as tolerated Diet:  Regular diet Other:  Follow-up with local mental health and continue current medicine  Alethia Berthold, MD 08/07/2016, 8:41 PM
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
## 2016-08-07 NOTE — BHH Suicide Risk Assessment (Signed)
BHH INPATIENT:  Family/Significant Other Suicide Prevention Education  Suicide Prevention Education:  Education Completed; Sister, Lenis NoonShannon Mckamie Ph#: [phone]  has been identified by the patient as the family member/significant other with whom the patient will be residing, and identified as the person(s) who will aid the patient in the event of a mental health crisis (suicidal ideations/suicide attempt).  With written consent from the patient, the family member/significant other has been provided the following suicide prevention education, prior to the and/or following the discharge of the patient. Pt sister is demanding that pt is discharged. CSW explained that MD will continue monitoring medication and make decision when pt is stable. CSW informed Dr. Toni Amendlapacs, he will try to contact pt's sister to inform her of pt's treatment.   The suicide prevention education provided includes the following:  Suicide risk factors  Suicide prevention and interventions  National Suicide Hotline telephone number  [facility] assessment telephone number  [facility] Emergency Assistance 911  [facility] and/or Residential Mobile Crisis Unit telephone number  Request made of family/significant other to:  Remove weapons (e.g., guns, rifles, knives), all items previously/currently identified as safety concern.    Remove drugs/medications (over-the-counter, prescriptions, illicit drugs), all items previously/currently identified as a safety concern.  The family member/significant other verbalizes understanding of the suicide prevention education information provided.  The family member/significant other agrees to remove the items of safety concern listed above.  Lynden OxfordKadijah R Doretha Goding, MSW, LCSW-A 08/07/2016, 3:46 PM

## 2016-08-07 NOTE — BHH Group Notes (Signed)
BHH LCSW Group Therapy Note  Date/Time: 08/07/16 1300  Type of Therapy and Topic:  Group Therapy:  Feelings around Relapse and Recovery  Participation Level:  Active  Mood: Pleasant  Description of Group:    Patients in this group will discuss emotions they experience before and after a relapse. They will process how experiencing these feelings, or avoidance of experiencing them, relates to having a relapse. Facilitator will guide patients to explore emotions they have related to recovery. Patients will be encouraged to process which emotions are more powerful. They will be guided to discuss the emotional reaction significant others in their lives may have to patients' relapse or recovery. Patients will be assisted in exploring ways to respond to the emotions of others without this contributing to a relapse.  Therapeutic Goals: 1. Patient will identify two or more emotions that lead to relapse for them:  2. Patient will identify two emotions that result when they relapse:  3. Patient will identify two emotions related to recovery:  4. Patient will demonstrate ability to communicate their needs through discussion and/or role plays.   Summary of Patient Progress: Pt was active participant in group and identified frustration and fear as two emotions that often lead to problems for her.  Pt continues to speak of her difficulties with family members.    Therapeutic Modalities:   Cognitive Behavioral Therapy Solution-Focused Therapy Assertiveness Training Relapse Prevention Therapy  Daleen SquibbGreg Nova Schmuhl, LCSW

## 2016-08-07 NOTE — Progress Notes (Signed)
Recreation Therapy Notes  Date: 01.19.18 Time: 9:30 am Location: Craft Room  Group Topic: Coping Skills  Goal Area(s) Addresses:  Patient will participate in healthy coping skill. Patient will verbalize benefit of using art as a coping skill.  Behavioral Response: Attentive, Interactive  Intervention: Coloring  Activity: Patients were given coloring sheets and were instructed to think about what emotions they were feeling and what their minds were focused on.  Education: LRT educated patients on healthy coping skills.  Education Outcome: Acknowledges education/In group clarification offered  Clinical Observations/Feedback: Patient colored coloring sheet. Patient contributed to group discussion by stating what makes art a good coping skill.  Jacquelynn CreeGreene,Marrion Finan M, LRT/CTRS 08/07/2016 10:22 AM
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
## 2016-08-08 NOTE — Tx Team (Signed)
Interdisciplinary Treatment and Diagnostic Plan Update  08/08/2016 (Late entry from 08/07/2016) Time of Session: 11:30am Laurie Stevens MRN: [id number]  Principal Diagnosis: Bipolar disorder, mixed (HCC)  Secondary Diagnoses: Principal Problem:   Bipolar disorder, mixed (HCC)   Current Medications:  Current Facility-Administered Medications  Medication Dose Route Frequency Provider Last Rate Last Dose  . acetaminophen (TYLENOL) tablet 650 mg  650 mg Oral Q6H PRN Audery Amel, MD      . alum & mag hydroxide-simeth (MAALOX/MYLANTA) 200-200-20 MG/5ML suspension 30 mL  30 mL Oral Q4H PRN Audery Amel, MD      . lamoTRIgine (LAMICTAL) tablet 25 mg  25 mg Oral Daily Audery Amel, MD   25 mg at 08/08/16 4098  . magnesium hydroxide (MILK OF MAGNESIA) suspension 30 mL  30 mL Oral Daily PRN Audery Amel, MD      . QUEtiapine (SEROQUEL) tablet 100 mg  100 mg Oral QHS Audery Amel, MD   100 mg at 08/07/16 2149  . QUEtiapine (SEROQUEL) tablet 25 mg  25 mg Oral QID PRN Audery Amel, MD      . white petrolatum (VASELINE) gel   Topical PRN Shari Prows, MD       PTA Medications: Prescriptions Prior to Admission  Medication Sig Dispense Refill Last Dose  . loratadine (CLARITIN) 10 MG tablet Take 1 tablet (10 mg total) by mouth daily. (May purchase this medicine from over the counter at the pharmacy): For allergies 30 tablet 0 Past Week at Unknown time  . diclofenac sodium (VOLTAREN) 1 % GEL Apply 2 g topically 4 (four) times daily. For arthritic pain   unknown at Unknown time  . lamoTRIgine (LAMICTAL) 25 MG tablet Take 1 tablet (25 mg total) by mouth daily at 8 pm. For mood stabilization 30 tablet 0 06/04/2016 at unknown  . Multiple Vitamin (MULTIVITAMIN WITH MINERALS) TABS tablet Take 1 tablet by mouth daily. For low Vitamin     . QUEtiapine (SEROQUEL) 50 MG tablet Take 1 tablet (50 mg total) by mouth at bedtime. For mood control 30 tablet 0     Patient Stressors: Financial  difficulties Medication change or noncompliance Substance abuse  Patient Strengths: Ability for insight Average or above average intelligence Motivation for treatment/growth Supportive family/friends  Treatment Modalities: Medication Management, Group therapy, Case management,  1 to 1 session with clinician, Psychoeducation, Recreational therapy.   Physician Treatment Plan for Primary Diagnosis: Bipolar disorder, mixed (HCC) Long Term Goal(s): Improvement in symptoms so as ready for discharge Improvement in symptoms so as ready for discharge   Short Term Goals: Ability to disclose and discuss suicidal ideas Ability to demonstrate self-control will improve Ability to demonstrate self-control will improve Ability to identify and develop effective coping behaviors will improve  Medication Management: Evaluate patient's response, side effects, and tolerance of medication regimen.  Therapeutic Interventions: 1 to 1 sessions, Unit Group sessions and Medication administration.  Evaluation of Outcomes: Progressing  Physician Treatment Plan for Secondary Diagnosis: Principal Problem:   Bipolar disorder, mixed (HCC)  Long Term Goal(s): Improvement in symptoms so as ready for discharge Improvement in symptoms so as ready for discharge   Short Term Goals: Ability to disclose and discuss suicidal ideas Ability to demonstrate self-control will improve Ability to demonstrate self-control will improve Ability to identify and develop effective coping behaviors will improve     Medication Management: Evaluate patient's response, side effects, and tolerance of medication regimen.  Therapeutic Interventions: 1 to 1 sessions,  Unit Group sessions and Medication administration.  Evaluation of Outcomes: Progressing   RN Treatment Plan for Primary Diagnosis: Bipolar disorder, mixed (HCC) Long Term Goal(s): Knowledge of disease and therapeutic regimen to maintain health will improve  Short  Term Goals: Ability to demonstrate self-control, Ability to participate in decision making will improve, Ability to verbalize feelings will improve and Compliance with prescribed medications will improve  Medication Management: RN will administer medications as ordered by provider, will assess and evaluate patient's response and provide education to patient for prescribed medication. RN will report any adverse and/or side effects to prescribing provider.  Therapeutic Interventions: 1 on 1 counseling sessions, Psychoeducation, Medication administration, Evaluate responses to treatment, Monitor vital signs and CBGs as ordered, Perform/monitor CIWA, COWS, AIMS and Fall Risk screenings as ordered, Perform wound care treatments as ordered.  Evaluation of Outcomes: Progressing   LCSW Treatment Plan for Primary Diagnosis: Bipolar disorder, mixed (HCC) Long Term Goal(s): Safe transition to appropriate next level of care at discharge, Engage patient in therapeutic group addressing interpersonal concerns.  Short Term Goals: Engage patient in aftercare planning with referrals and resources, Increase social support, Increase ability to appropriately verbalize feelings, Increase emotional regulation, Facilitate acceptance of mental health diagnosis and concerns and Increase skills for wellness and recovery  Therapeutic Interventions: Assess for all discharge needs, 1 to 1 time with Social worker, Explore available resources and support systems, Assess for adequacy in community support network, Educate family and significant other(s) on suicide prevention, Complete Psychosocial Assessment, Interpersonal group therapy.  Evaluation of Outcomes: Progressing   Progress in Treatment: Attending groups: Yes. Participating in groups: Yes. Taking medication as prescribed: Yes. Toleration medication: Yes. Family/Significant other contact made: Yes, individual(s) contacted:  sister Patient understands diagnosis:  Yes. Discussing patient identified problems/goals with staff: Yes. Medical problems stabilized or resolved: Yes. Denies suicidal/homicidal ideation: Yes. Issues/concerns per patient self-inventory: No. Other: n/a  New problem(s) identified: None identified at this time.   New Short Term/Long Term Goal(s): None identified at this time.   Discharge Plan or Barriers:   Reason for Continuation of Hospitalization: Medication stabilization  Estimated Length of Stay: 3 days  Attendees: Patient:Laurie Stevens 08/08/2016 11:55 AM  Physician: Mordecai RasmussenJohn Clapacs, MD 08/08/2016 11:55 AM  Nursing: Hulan AmatoGwen Farrish, RN 08/08/2016 11:55 AM  RN Care Manager: 08/08/2016 11:55 AM  Social Worker: Hampton AbbotKadijah Grant, LCSWA 08/08/2016 11:55 AM  Recreational Therapist: Jacquelynn CreeElizabeth M. Greene, LRT/CTRS 08/08/2016 11:55 AM  Other:  08/08/2016 11:55 AM  Other:  08/08/2016 11:55 AM  Other: 08/08/2016 11:55 AM    Scribe for Treatment Team: Arelia LongestAmaris G Bushra Denman, LCSWA 08/08/2016 11:59 AM
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
## 2016-08-08 NOTE — Progress Notes (Signed)
1945: Patient currently in the day room working on na puzzle. Alert and oriented x4. Denies suicidal/homicidal thoughts. Denies hallucinations. Reports that she is feeling improvement in mood. Has been attending groups. Reports that boyfriend visited and was supportive. Has no concern. Was encouraged to express her thoughts and feelings as needed. Therapeutic milieu promoted. Safety and security maintained.

## 2016-08-08 NOTE — Plan of Care (Signed)
Problem: Coping: Goal: Ability to verbalize frustrations and anger appropriately will improve Outcome: Progressing Patient verbalized feelings to staff.    

## 2016-08-08 NOTE — [facility] Group Notes (Signed)
1/20/201812:58 PM  Type of Therapy: Group Therapy  Participation Level: Active  Participation Quality: Appropriate  Affect: Appropriate  Cognitive: Alert  Insight: Developing/Improving  Engagement in Therapy: Improving  Modes of Intervention: Clarification, Discussion, Education, Limit-setting, Problem-solving, Reality Testing, Socialization and Support  Summary of Progress/Problems: Balance in life: Patients will discuss the concept of balance and how it looks and feels to be unbalanced. Pt will identify areas in their life that is unbalanced and ways to become more balanced. They discussed what aspects in their lives has influenced their self care.Patients also discussed self care in the areas of self regulation/control, hygiene/appearance, sleep/relaxation, healthy leisure, healthy eating habits, exercise, inner peace/spirituality, self improvement, sobriety, and health management.They were challenged to identify changes that are needed in order to improve self care. Patient was pretty quite but supported her peers and was developing insight from listening to others.   Laurie Stevens M. Johnella MoloneyBandi, Alexander MtLCSW 08/08/2016

## 2016-08-08 NOTE — Progress Notes (Signed)
Denies SI/HI/AVH.  Bright affect.  Interacting with peers and staff appropriately.  Support and encouragement offered.  Safety maintained.

## 2016-08-08 NOTE — Progress Notes (Signed)
D: Pt denies SI/HI/AVH. Pt is pleasant and cooperative, affect is flat but brightens upon approach, patient thoughts are organized.  Pt appears less anxious and she is interacting with peers and staff appropriately.  A: Pt was offered support and encouragement. Pt was given scheduled medications. Pt was encouraged to attend groups. Q 15 minute checks were done for safety.  R:Pt attends groups and interacts well with peers and staff. Pt is taking medication. Pt has no complaints.Pt receptive to treatment and safety maintained on unit.

## 2016-08-09 MED ORDER — QUETIAPINE FUMARATE 25 MG PO TABS: 25.0000 mg | ORAL_TABLET | Freq: Four times a day (QID) | ORAL | 1 refills | Status: DC | PRN

## 2016-08-09 MED ORDER — LAMOTRIGINE 25 MG PO TABS: 25.0000 mg | ORAL_TABLET | Freq: Every day | ORAL | 1 refills | Status: DC

## 2016-08-09 MED ORDER — QUETIAPINE FUMARATE 100 MG PO TABS
100.0000 mg | ORAL_TABLET | Freq: Every day | ORAL | 1 refills | Status: DC
Start: 2016-08-09 — End: 2017-02-18

## 2016-08-09 NOTE — Plan of Care (Signed)
Problem: Coping: Goal: Ability to identify and develop effective coping behavior will improve Outcome: Progressing Patient visible in the milieu, calm and cooperative. Expressing her feelings as needed.

## 2016-08-09 NOTE — Progress Notes (Signed)
2300: Patient stayed in the milieu. Played puzzle with peers. Pleasant and supportive to peers. Discussed about her health  plan, reporting that she is trying to quit smoking marijuana after the doctor told her that it can be dangerous. Willing to join supportive groups after discharge.  Attended wrap up group. Had medications and snack. Had no major concern. Currently in bed resting. Safety precautions maintained.

## 2016-08-09 NOTE — Progress Notes (Signed)
Patient with appropriate affect, cooperative behavior with meals, meds and plan of care. No SI/HI at this time. Meets with MD. Patient to discharge today when discharge plan and transportation in place. Safety maintained.

## 2016-08-09 NOTE — [facility] Suicide Risk Assessment (Signed)
[facility] Admission Suicide Risk Assessment   Nursing information obtained from:   Chart and per patient Demographic factors:   26 y/o single Caucasian female living in [place] Current Mental Status:   see below Loss Factors:   family conflict  Historical Factors:   1 prior inpatient psychiatric hospitalization Risk Reduction Factors:   compliance with medications  Total Time spent with patient: 45 minutes Principal Problem: Bipolar disorder, mixed ([place]) Diagnosis:   Patient Active Problem List   Diagnosis Date Noted  . Bipolar disorder, mixed ([place]) [F31.60] 08/05/2016  . Bipolar disorder, curr episode mixed, severe, with psychotic features ([place]) [F31.64] 03/05/2016  . Cannabis use disorder, mild, abuse [F12.10] 03/05/2016  . Abdominal bloating [R14.0] 12/02/2010  . Weight gain [R63.5] 12/02/2010  . Family history of celiac disease [Z83.79] 12/02/2010  . EDEMA [R60.9] 01/17/2008  . CHEST PAIN [R07.9] 01/17/2008  . 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 Patient was agitated and confused and appeared psychotic in the emergency room. Patient admits that she has not been taking any psychiatric medicine for months. She says she had not slept for several days. Admits to heavy use of marijuana daily. Patient was aware that her mood was anxious and feeling stressed out. She denies being aware of any hallucinations. Denies suicidal or homicidal ideation. Associated Signs/Symptoms: Depression Symptoms:  insomnia, difficulty concentrating, impaired memory, anxiety, (Hypo) Manic Symptoms:  Distractibility, Impulsivity, Irritable Mood, Anxiety Symptoms:  Excessive Worry, Psychotic Symptoms:  Confusion and bizarre thinking with some elements of paranoia PTSD Symptoms: Negative Total Time spent with patient: 1 hour  Past Psychiatric History:  Patient [facility] and was diagnosed with bipolar disorder. She was discharged on a low dose of Seroquel and lamotrigine. Patient did not follow-up with outpatient treatment. Patient evidently has had problems with mood instability and chaotic behavior for some time. Patient also abuses large amounts of marijuana daily but insists that this could not possibly be a problem and has very poor insight. Patient does not have a history of suicide attempts identified  Is the patient at risk to self? Yes.    Has the patient been a risk to self in the past 6 months? Yes.    Has the patient been a risk to self within the distant past? No.  Is the patient a risk to others? No.  Has the patient been a risk to others in the past 6 months? No.  Has the patient been a risk to others within the distant past? No.    Continued Clinical Symptoms:  Alcohol Use Disorder Identification Test Final Score (AUDIT): 3 The "Alcohol Use Disorders Identification Test", Guidelines for Use in Primary Care, Second Edition.  World Pharmacologist [facility]). Score between 0-7:  no or low risk or alcohol related problems. Score between 8-15:  moderate risk of alcohol related problems. Score between 16-19:  high risk of alcohol related problems. Score 20 or above:  warrants further diagnostic evaluation for alcohol dependence and treatment.   CLINICAL FACTORS:   Severe Anxiety and/or Agitation Bipolar Disorder:   Bipolar II Previous Psychiatric Diagnoses and Treatments   Musculoskeletal: Strength & Muscle Tone: within normal limits Gait & Station: normal Patient leans: N/A  Psychiatric Specialty Exam: Physical Exam  Review of Systems  Constitutional: Negative.   HENT: Negative.  Eyes: Negative.   Respiratory: Negative.   Cardiovascular: Negative.   Gastrointestinal: Negative.   Genitourinary: Negative.   Musculoskeletal: Negative.    Skin: Negative.   Neurological: Negative.   Endo/Heme/Allergies: Negative.     Blood pressure 114/71, pulse 70, temperature 98.2 F (36.8 C), temperature source Oral, resp. rate 18, height 5' 6"  (1.676 m), weight 63.5 kg (139 lb 15.9 oz), SpO2 100 %.Body mass index is 22.6 kg/m.  MSE: See Discharge Summary                                                        COGNITIVE FEATURES THAT CONTRIBUTE TO RISK:  None    SUICIDE RISK:   Minimal: No identifiable suicidal ideation.  Patients presenting with no risk factors but with morbid ruminations; may be classified as minimal risk based on the severity of the depressive symptoms. She denies any access to guns. She was advised to abstain from marijuana and all illicit drugs as they may worsen mood symptoms.  PLAN OF CARE:   Ms. Brecheisen a 26 year old single Caucasian female with history of bipolar disorder who is admitted to [facility] inpatient psychiatry after acting bizarrely when interacting with law enforcement. She has been smoking marijuana heavily on a daily basis and has been noncompliant with psychotropic medications for the past several months after being given a diagnosis of bipolar disorder in September 2017.   Bipolar disorder, most recent episode depressed: The patient was fairly calm and cooperative on the unit and compliant with psychotropic medications. She had some confusion about the events prior to admission and minimized the paranoid thoughts that occurred when she interacted with law enforcement. The patient was restarted on Lamictal 25 mg by mouth daily on January 18 as she had success with Lamictal in the past. The patient was aware of the risk of Stevens-Johnson syndrome with Lamictal and had tolerated medication well in the past. No signs of a rash. She was also started on Seroquel 100 mg by mouth nightly and 25 mg by mouth 4 times a day as needed for agitation and  psychosis. She did not have any active psychosis on the unit and did not need any when necessary Seroquel. She did tolerate with Lamictal and Seroquel fairly well without any physical adverse side effects associated with the medication. She interacted with staff and peers on the unit appropriately and no behavioral disturbances were noted. She did endorse some depressive symptoms in the context of noncompliance with medication at the time of admission but did not have any active suicidal thoughts on the inpatient unit. She did actively participate in groups and appeared to generally want to learn more about coping skills and setting boundaries for herself when interacting with family members. Her boyfriend did visit her while on the inpatient psychiatry unit and she did appear to have some support from him.   Cannabis use disorder, severe: The patient was advised to abstain from marijuana and all illicit drugs as they may worsen anxiety mood symptoms. She is not interested in any formal substance abuse treatment. The patient will be encouraged to abstain from marijuana and all illicit drugs after discharge.  Supportive psychotherapy was provided with daily visits. The patient will be encouraged to pursue individual therapy after discharge to help improve interactions with family members  and coping skills.  Labs:Total cholesterol was 125, hemoglobin A1c was 4.7 and prolactin level was 34.2. The patient was made aware of metabolic syndrome associated with Seroquel. EKG showed a QTC of 392. Patient was educated about metabolic syndrome associated with being on an atypical and psychotic such as Seroquel.  Disposition: The patient will be discharged back to her prior living situation with her grandmother. She will need psychotropic medication management after discharge and was referred to [facility] in [place] as she does not currently have any healthcare insurance. Patient was also encouraged to pursue  individual therapy after discharge.at the time of discharge, the patient denied any current active or passive suicidal thoughts. She denied any intent to harm herself or anyone else and was able to contract for safety outside of hospital. She denied having access to guns currently. She verbalized understanding of the discharge plan and need for outpatient psychotropic medication management at the time of discharge.    I certify that inpatient services furnished can reasonably be expected to improve the patient's condition.   Jay Schlichter, MD 08/09/2016, 8:58 AM

## 2016-08-09 NOTE — Plan of Care (Signed)
Problem: Health Behavior/Discharge Planning: Goal: Ability to identify changes in lifestyle to reduce recurrence of condition will improve Outcome: Progressing Willing to attend Supportive groups upon discharge. Reports she will try to quit smoking marijuana "because the doctor told me it is not good".

## 2016-08-09 NOTE — Discharge Summary (Signed)
Physician Discharge Summary Note  Patient:  Laurie Stevens is an 26 y.o., female MRN:  [id number] DOB:  12/09/1990 Patient phone:  [phone] (home)  Patient address:   [address] [place] [address],  Total Time spent with patient: 45 minutes  Date of Admission:  08/05/2016 Date of Discharge: 08/09/2016  Reason for Admission: Bizarre behavior including mild paranoid thoughts  History of Present Illness: This is a 26 year old woman brought in by police after she was found acting bizarrely in public. Patient was agitated and confused and appeared psychotic in the emergency room. Patient admits that she has not been taking any psychiatric medicine for months. She says she had not slept for several days. Admits to heavy use of marijuana daily. Patient was aware that her mood was anxious and feeling stressed out. She denies being aware of any hallucinations. Denies suicidal or homicidal ideation. Associated Signs/Symptoms: Depression Symptoms:  insomnia, difficulty concentrating, impaired memory, anxiety, (Hypo) Manic Symptoms:  Distractibility, Impulsivity, Irritable Mood, Anxiety Symptoms:  Excessive Worry, Psychotic Symptoms:  Confusion and bizarre thinking with some elements of paranoia PTSD Symptoms: Negative Total Time spent with patient: 1 hour  Past Psychiatric History: Patient [facility] and was diagnosed with bipolar disorder. She was discharged on a low dose of Seroquel and lamotrigine. Patient did not follow-up with outpatient treatment. Patient evidently has had problems with mood instability and chaotic behavior for some time. Patient also abuses large amounts of marijuana daily but insists that this could not possibly be a problem and has very poor insight. Patient does not have a history of suicide attempts identified  Social History: The patient is currently single and has never been  married and has no children. She does have a boyfriend. She currently lives on her grandmother's property in [place]. She has a high school degree and  She works for [employer].  Is the patient at risk to self? Yes.    Has the patient been a risk to self in the past 6 months? Yes.    Has the patient been a risk to self within the distant past? No.  Is the patient a risk to others? No.  Has the patient been a risk to others in the past 6 months? No.  Has the patient been a risk to others within the distant past? No.   Prior Inpatient Therapy:  Yes Prior Outpatient Therapy:  Yes  Alcohol Screening: 1. How often do you have a drink containing alcohol?: 2 to 4 times a month 2. How many drinks containing alcohol do you have on a typical day when you are drinking?: 3 or 4 3. How often do you have six or more drinks on one occasion?: Never Preliminary Score: 1 4. How often during the last year have you found that you were not able to stop drinking once you had started?: Never 5. How often during the last year have you failed to do what was normally expected from you becasue of drinking?: Never 6. How often during the last year have you needed a first drink in the morning to get yourself going after a heavy drinking session?: Never 7. How often during the last year have you had a feeling of guilt of remorse after drinking?: Never 8. How often during the last year have you been unable to remember what happened the night before because you had been drinking?: Never 9. Have you  or someone else been injured as a result of your drinking?: No 10. Has a relative or friend or a doctor or another health worker been concerned about your drinking or suggested you cut down?: No Alcohol Use Disorder Identification Test Final Score (AUDIT): 3 Brief Intervention: AUDIT score less than 7 or less-screening does not suggest unhealthy drinking-brief intervention not indicated Substance Abuse History in  the last 12 months:  Yes.   Consequences of Substance Abuse: Significant impairment and worsening of her mental health issues Previous Psychotropic Medications: Yes  Psychological Evaluations: Yes  Past Medical History:      Past Medical History:  Diagnosis Date  . 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 Pneumonia 05/04   RML   History reviewed. No pertinent surgical history. Family History:        Family History  Problem Relation Age of Onset  . Celiac disease Sister   . Thyroid disease Sister   . Thyroid disease Sister     autoimmune disease  . Diabetes type II    . Hypertension    . Hypertension Maternal Grandmother   . Cancer Maternal Grandmother   . Cancer - Cervical Maternal Grandmother   . Suicidality Maternal Grandfather   . Alcoholism Maternal Uncle    Family Psychiatric  History: Patient denies any family history of mental illness Tobacco Screening: Have you used any form of tobacco in the last 30 days? (Cigarettes, Smokeless Tobacco, Cigars, and/or Pipes): No   Principal Problem: Bipolar disorder, mixed [facility]) Discharge Diagnoses: Patient Active Problem List   Diagnosis Date Noted  . Bipolar disorder, mixed (HCC) [F31.60] 08/05/2016  . Bipolar disorder, curr episode mixed, severe, with psychotic features (HCC) [F31.64] 03/05/2016  . Cannabis use disorder, mild, abuse [F12.10] 03/05/2016  . Abdominal bloating [R14.0] 12/02/2010  . Weight gain [R63.5] 12/02/2010  . Family history of celiac disease [Z83.79] 12/02/2010  . EDEMA [R60.9] 01/17/2008  . CHEST PAIN [R07.9] 01/17/2008  . 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 Pneumonia 05/04   RML   History reviewed. No pertinent  surgical history. Family History:  Family History  Problem Relation Age of Onset  . Celiac disease Sister   . Thyroid disease Sister   . Thyroid disease Sister     autoimmune disease  . Diabetes type II    . Hypertension    . Hypertension Maternal Grandmother   . Cancer Maternal Grandmother   . Cancer - Cervical Maternal Grandmother   . Suicidality Maternal Grandfather   . Alcoholism Maternal Uncle    Family Psychiatric  History: Maternal grandfather struggled with depression  Social History:  History  Alcohol Use No    Comment: rarely     History  Drug Use  . Types: Marijuana    Comment: Marijuana    Social History   Social History  . Marital status: Single    Spouse name: N/A  . Number of children: N/A  . Years of education: N/A   Social History Main Topics  . Smoking status: Never Smoker  . Smokeless tobacco: Never Used  . Alcohol use No     Comment: rarely  . Drug use: Yes    Types:  Marijuana     Comment: Marijuana  . Sexual activity: Not Currently   Other Topics Concern  . 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 She has been smoking marijuana heavily on a daily basis and has been noncompliant with psychotropic medications for the past several months after being given a diagnosis of bipolar disorder in September 2017.   Bipolar disorder, most recent episode depressed: The patient was fairly calm and cooperative on the unit and compliant with psychotropic medications. She had some  confusion about the events prior to admission and minimized the paranoid thoughts that occurred when she interacted with law enforcement. The patient was restarted on Lamictal 25 mg by mouth daily on January 18 as she had success with Lamictal in the past. The patient was aware of the risk of Stevens-Johnson syndrome with Lamictal and had tolerated medication well in the past. No signs of a rash. She was also started on Seroquel 100 mg by mouth nightly and 25 mg by mouth 4 times a day as needed for agitation and psychosis. She did not have any active psychosis on the unit and did not need any when necessary Seroquel. She did tolerate with Lamictal and Seroquel fairly well without any physical adverse side effects associated with the medication. She interacted with staff and peers on the unit appropriately and no behavioral disturbances were noted. She did endorse some depressive symptoms in the context of noncompliance with medication at the time of admission but did not have any active suicidal thoughts on the inpatient unit. She did actively participate in groups and appeared to generally want to learn more about coping skills and setting boundaries for herself when interacting with family members. Her boyfriend did visit her while on the inpatient psychiatry unit and she did appear to have some support from him.   Cannabis use disorder, severe: The patient was advised to abstain from marijuana and all illicit drugs as they may worsen anxiety mood symptoms. She is not interested in any formal substance abuse treatment. The patient will be encouraged to abstain from marijuana and all illicit drugs after discharge.  Supportive psychotherapy was provided with daily visits. The patient will be encouraged to pursue individual therapy after discharge to help improve interactions with family members and coping skills.  Labs: Total cholesterol was 125, hemoglobin A1c was 4.7 and prolactin level was 34.2. The  patient was made aware of metabolic syndrome associated with Seroquel. EKG showed a QTC of 392. Patient was educated about metabolic syndrome associated with being on an atypical and psychotic such as Seroquel.   Disposition: The patient will be discharged back to her prior living situation with her grandmother. She will need psychotropic medication management after discharge and was referred to [facility] in [place] as she does not currently have any healthcare insurance. Patient was also encouraged to pursue individual therapy after discharge.at the time of discharge, the patient denied any current active or  passive suicidal thoughts. She denied any intent to harm herself or anyone else and was able to contract for safety outside of hospital. She denied having access to guns currently. She verbalized understanding of the discharge plan and need for outpatient psychotropic medication management at the time of discharge.    Musculoskeletal: Strength & Muscle Tone: within normal limits Gait & Station: normal Patient leans: N/A  Psychiatric Specialty Exam: Physical Exam  Review of Systems  Constitutional: Negative.   HENT: Negative.   Eyes: Negative.   Respiratory: Negative.   Cardiovascular: Negative.   Gastrointestinal: Negative.   Genitourinary: Negative.   Musculoskeletal: Negative.   Skin: Negative.   Neurological: Negative.   Endo/Heme/Allergies: Negative.     Blood pressure 114/71, pulse 70, temperature 98.2 F (36.8 C), temperature source Oral, resp. rate 18, height 5\' 6"  (1.676 m), weight 63.5 kg (139 lb 15.9 oz), SpO2 100 %.Body mass index is 22.6 kg/m.  General Appearance: Casual  Eye Contact:  Good  Speech:  Clear and Coherent and Normal Rate  Volume:  Normal  Mood:  "Better"  Affect:  Congruent  Thought Process:  Coherent, Goal Directed and Linear  Orientation:  Full (Time, Place, and Person)  Thought Content:  Logical  Suicidal Thoughts:  No  Homicidal Thoughts:   No  Memory:  Immediate;   Good Recent;   Good Remote;   Good  Judgement:  Good  Insight:  Good  Psychomotor Activity:  Normal  Concentration:  Concentration: Good and Attention Span: Good  Recall:  Good  Fund of Knowledge:  Good  Language:  Good  Akathisia:  No  Handed:  Right  AIMS (if indicated):     Assets:  Communication Skills Desire for Improvement Housing Physical Health  ADL's:  Intact  Cognition:  WNL  Sleep:  Number of Hours: 5.5     Have you used any form of tobacco in the last 30 days? (Cigarettes, Smokeless Tobacco, Cigars, and/or Pipes): No  Has this patient used any form of tobacco in the last 30 days? (Cigarettes, Smokeless Tobacco, Cigars, and/or Pipes) Yes, No  Blood Alcohol level:  Lab Results  Component Value Date   ETH <5 08/05/2016   ETH <5 03/03/2016    Metabolic Disorder Labs:  Lab Results  Component Value Date   HGBA1C 4.7 (L) 08/06/2016   MPG 88 08/06/2016   MPG 94 03/06/2016   Lab Results  Component Value Date   PROLACTIN 34.2 (H) 08/06/2016   PROLACTIN 75.6 (H) 03/06/2016   Lab Results  Component Value Date   CHOL 125 08/06/2016   TRIG 66 08/06/2016   HDL 42 08/06/2016   CHOLHDL 3.0 08/06/2016   VLDL 13 08/06/2016   LDLCALC 70 08/06/2016   LDLCALC 75 03/06/2016    See Psychiatric Specialty Exam and Suicide Risk Assessment completed by Attending Physician prior to discharge.  Discharge destination:  Home  Is patient on multiple antipsychotic therapies at discharge:  No   Has Patient had three or more failed trials of antipsychotic monotherapy by history:  No  Recommended Plan for Multiple Antipsychotic Therapies: NA  Discharge Instructions    Diet - low sodium heart healthy    Complete by:  As directed    Increase activity slowly    Complete by:  As directed      Allergies as of 08/09/2016      Reactions   Lactose Intolerance (gi) Diarrhea      Medication List    STOP taking these  medications   multivitamin  with minerals Tabs tablet     TAKE these medications     Indication  diclofenac sodium 1 % Gel Commonly known as:  VOLTAREN Apply 2 g topically 4 (four) times daily. For arthritic pain  Indication:  Joint Damage causing Pain and Loss of Function   lamoTRIgine 25 MG tablet Commonly known as:  LAMICTAL Take 1 tablet (25 mg total) by mouth daily at 8 pm. For mood stabilization What changed:  Another medication with the same name was added. Make sure you understand how and when to take each.  Indication:  Mood stabilization   lamoTRIgine 25 MG tablet Commonly known as:  LAMICTAL Take 1 tablet (25 mg total) by mouth daily. What changed:  You were already taking a medication with the same name, and this prescription was added. Make sure you understand how and when to take each.  Indication:  Manic-Depression   loratadine 10 MG tablet Commonly known as:  CLARITIN Take 1 tablet (10 mg total) by mouth daily. (May purchase this medicine from over the counter at the pharmacy): For allergies  Indication:  Hayfever   QUEtiapine 100 MG tablet Commonly known as:  SEROQUEL Take 1 tablet (100 mg total) by mouth at bedtime. What changed:  medication strength  how much to take  additional instructions  Indication:  Manic Phase of Manic-Depression   QUEtiapine 25 MG tablet Commonly known as:  SEROQUEL Take 1 tablet (25 mg total) by mouth 4 (four) times daily as needed (anxiety). What changed:  You were already taking a medication with the same name, and this prescription was added. Make sure you understand how and when to take each.  Indication:  Manic Phase of Manic-Depression      Follow-up Information    [facility]. Go in 7 day(s).   Specialty:  Behavioral Health Why:  Follow-up with 7 days of discharge for outpatient services. Walk-in hours for new patietns are M-F 8a-4p. Bring photo I,D, discharge summary, and current medications to appointment. Arrive early to ensure prompt service.   [address] [place] [address] [phone]           Follow-up recommendations:  Other:  Psychotropic medication mangagement and individual therapy  Comments: Patient to be discharged to the care of her boyfriend  Signed: Levora Angel, MD 08/09/2016, 8:42 AM

## 2016-08-09 NOTE — Progress Notes (Signed)
[facility] MD Progress Note  08/09/2016 11:25 AM Laurie Stevens  MRN:  [id number]   Subjective:  26 year old woman with a history of mood instability brought into the hospital in a labile agitated confused fashion.   The patient reports that her boyfriend came to visit her last night. She says she has been doing fairly well this morning. She enjoyed working on puzzles with peers on the unit last night and did attend several groups. She feels like she is learning different coping skills to help improve interactions with family members. The patient denies any current active or passive suicidal thoughts and says overall her mood has improved. She is hoping to get this full-time job which would help her financially and take a lot of stress off of her. She denies any problems with insomnia or change in appetite, weight gain or weight loss. She is tolerating the Lamictal and Seroquel fairly well and denies any physical adverse side effects. No somatic complaints. Times spent discussing marijuana use and negative consequences on mood associated with marijuana. The patient agreed to decrease use but was not yet agreeable to abstaining from marijuana. She is not interested in any substance abuse treatment. She is willing to pursue individual therapy and psychotropic medication management after discharge and was aware of the plans for follow-up.  Supportive psychotherapy provided and time spent addressing issues regarding family conflict and how to avoid specific situations that lead to negative interactions with family members.  Principal Problem: Bipolar disorder, mixed (HCC) Diagnosis:   Patient Active Problem List   Diagnosis Date Noted  . Bipolar disorder, mixed (HCC) [F31.60] 08/05/2016  . Bipolar disorder, curr episode mixed, severe, with psychotic features (HCC) [F31.64] 03/05/2016  . Cannabis use disorder, mild, abuse [F12.10] 03/05/2016  . Abdominal bloating [R14.0] 12/02/2010  . Weight gain [R63.5]  12/02/2010  . Family history of celiac disease [Z83.79] 12/02/2010  . EDEMA [R60.9] 01/17/2008  . CHEST PAIN [R07.9] 01/17/2008  . 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 History of medicine noncompliance recently.  Past Medical History:  Past Medical History:  Diagnosis Date  . 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 Pneumonia 05/04   RML   History reviewed. No pertinent surgical history. Family History:  Family History  Problem Relation Age of Onset  . Celiac disease Sister   . Thyroid disease Sister   . Thyroid disease Sister     autoimmune disease  . Diabetes type II    . Hypertension    . Hypertension Maternal Grandmother   . Cancer Maternal Grandmother   . Cancer - Cervical Maternal Grandmother   . Suicidality Maternal Grandfather   . Alcoholism Maternal Uncle    Family Psychiatric  History: Positive for bipolar disorder Social History:  History  Alcohol Use No    Comment: rarely     History  Drug Use  . Types: Marijuana    Comment: Marijuana    Social History   Social History  . Marital status: Single    Spouse name: N/A  . Number of children: N/A  . Years of education: N/A   Social History Main Topics  . Smoking status: Never Smoker  . Smokeless tobacco: Never Used  . Alcohol use No  Comment: rarely  . Drug use: Yes    Types: Marijuana     Comment: Marijuana  . Sexual activity: Not Currently   Other Topics Concern  . 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 acetaminophen (TYLENOL) tablet 650 mg  650 mg Oral Q6H PRN Audery Amel, MD      . alum & mag hydroxide-simeth (MAALOX/MYLANTA) 200-200-20 MG/5ML suspension 30 mL  30 mL Oral Q4H PRN Audery Amel, MD      . lamoTRIgine (LAMICTAL) tablet 25 mg  25 mg Oral Daily Audery Amel, MD   25 mg at 08/09/16 0809  . magnesium hydroxide (MILK OF MAGNESIA) suspension 30 mL  30 mL Oral Daily PRN Audery Amel, MD      . QUEtiapine (SEROQUEL) tablet 100 mg  100 mg Oral QHS Audery Amel, MD   100 mg at 08/08/16 2137  . QUEtiapine (SEROQUEL) tablet 25 mg  25 mg Oral QID PRN Audery Amel, MD      . white petrolatum (VASELINE) gel   Topical PRN Jolanta B Pucilowska, MD        Lab Results:  No results found for this or any previous visit (from the past 48 hour(s)).  Blood Alcohol level:  Lab Results  Component Value Date   ETH <5 08/05/2016   ETH <5 03/03/2016    Metabolic Disorder Labs: Lab Results  Component Value Date   HGBA1C 4.7 (L) 08/06/2016   MPG 88 08/06/2016   MPG 94 03/06/2016   Lab Results  Component Value Date   PROLACTIN 34.2 (H) 08/06/2016   PROLACTIN 75.6 (H) 03/06/2016   Lab Results  Component Value Date   CHOL 125 08/06/2016   TRIG 66 08/06/2016   HDL 42 08/06/2016   CHOLHDL 3.0 08/06/2016   VLDL 13 08/06/2016   LDLCALC 70 08/06/2016   LDLCALC 75 03/06/2016    Physical Findings: AIMS:  , ,  ,  ,    CIWA:    COWS:     Musculoskeletal: Strength & Muscle Tone: within normal limits Gait & Station: normal Patient leans: N/A  Psychiatric Specialty Exam: Physical Exam  Nursing note and vitals reviewed. Constitutional: She appears well-developed and well-nourished.  HENT:  Head: Normocephalic and atraumatic.  Eyes: Conjunctivae are normal. Pupils are equal, round, and reactive to  light.  Neck: Normal range of motion.  Cardiovascular: Regular rhythm and normal heart sounds.   Respiratory: Effort normal. No respiratory distress.  GI: Soft.  Musculoskeletal: Normal range of motion.  Neurological: She is alert.  Skin: Skin is warm and dry.  Psychiatric: Her speech is normal and behavior is normal. Judgment and thought content normal. Her mood appears anxious. Cognition and memory are normal.    Review of Systems  Constitutional: Negative.   HENT: Negative.   Eyes: Negative.   Respiratory: Negative.   Cardiovascular: Negative.   Gastrointestinal: Negative.   Musculoskeletal: Negative.   Skin: Negative.   Neurological:  Negative.     Blood pressure 114/71, pulse 70, temperature 98.2 F (36.8 C), temperature source Oral, resp. rate 18, height 5\' 6"  (1.676 m), weight 63.5 kg (139 lb 15.9 oz), SpO2 100 %.Body mass index is 22.6 kg/m.  General Appearance: Disheveled  Eye Contact:  Fair  Speech:  Normal Rate  Volume:  Normal  Mood:  I feel better  Affect:  Mildly depressed  Thought Process:  Coherent  Orientation:  Full (Time, Place, and Person)  Thought Content:  Logical  Suicidal Thoughts:  No  Homicidal Thoughts:  No  Memory:  Immediate;   Good Recent;   Fair Remote;   Good  Judgement:  Fair  Insight:  Fair  Psychomotor Activity:  Decreased  Concentration:  Concentration: Fair  Recall:  [employer] of Knowledge:  Fair  Language:  Fair  Akathisia:  No  Handed:  Right  AIMS (if indicated):     Assets:  Engineer, maintenance Physical Health Social Support  ADL's:  Intact  Cognition:  WNL  Sleep:  Number of Hours: 5.5    DIAGNOSIS  BIPOLAR disorder, most recent episode depressed Severe cannabis use disorder, severe HSP Severe: Family conflict, financial problems     Treatment Plan Summary:  Ms. Dowling is a 26 year old single Caucasian female with history of bipolar disorder who is admitted to [facility]  inpatient psychiatry after acting bizarrely when interacting with law enforcement. She has been smoking marijuana heavily on a daily basis and has been noncompliant with psychotropic medications for the past several months after being given a diagnosis of bipolar disorder in September 2017.   Bipolar disorder, most recent episode depressed: The patient was restarted on Lamictal 25 mg by mouth daily on January 18. She had been on Lamictal in the past with success. She was also started on Seroquel 100 mg by mouth nightly and 25 mg by mouth 4 times a day when necessary for agitation. She has not needed any prn Seroquel. Total cholesterol was 125, hemoglobin A1c was 4.7 and prolactin level was 34.2. The patient was made aware of metabolic syndrome associated with Seroquel. EKG showed a QTC of 392  Cannabis use disorder, severe: The patient was advised to abstain from marijuana and all illicit drugs as they may worsen anxiety mood symptoms. She is not interested in any substance abuse treatment. The patient will be encouraged to abstain from marijuana and all illicit drugs after discharge.  We'll continue supportive psychotherapy with rounding. The patient will be encouraged to pursue individual therapy after discharge to help improve interactions with family members and coping skills.  Disposition: At this time, the patient does have a stable living situation in [place] with her grandmother. She will need psychotropic medication management after discharge. Currently she does not have insurance and will most likely be referred to [facility] in [place]. She was also encouraged to pursue individual therapy after discharge. Will proceed with discharge today.   Daily contact with patient to assess and evaluate symptoms and progress in treatment, Medication management and Plan Patient is taking a modest dose of Seroquel and showing good response and tolerance. Her affect is becoming more stable. We are  tentatively planning for likely discharge on Sunday. Follow-up with local mental health and her community per social work  Levora Angel, MD 08/09/2016, 11:25 AM

## 2016-08-09 NOTE — Progress Notes (Signed)
Patient stayed in bed sleeping and woke up for morning vital signs. Alert and oriented, pleasant and cooperative. Denies SI/HI. Reports that she slept ok and has no concern. Support and encouragements provided. Therapeutic milieu promoted.

## 2016-08-09 NOTE — Progress Notes (Signed)
  [facility] :  Will you be returning to the same living situation after discharge:  Yes,  home  At discharge, do you have transportation home?: Yes,  boyfriend's sister Do you have the ability to pay for your medications: Yes,  patient has financial assitance from family and friends  Release of information consent forms completed and in the chart;  Patient's signature needed at discharge.  Patient to Follow up at: Follow-up Information    [facility]. Go in 7 day(s).   Specialty:  Behavioral Health Why:  Follow-up with 7 days of discharge for outpatient services. Walk-in hours for new patietns are M-F 8a-4p. Bring photo I,D, discharge summary, and current medications to appointment. Arrive early to ensure prompt service.  Contact information: [address] [place] [id number] [phone]           Next level of care provider has [facility] Link:no  Safety Planning and Suicide Prevention discussed: Yes,  with patient and sister  Have you used any form of tobacco in the last 30 days? (Cigarettes, Smokeless Tobacco, Cigars, and/or Pipes): No  Has patient been referred to the Quitline?: N/A patient is not a smoker  Patient has been referred for addiction treatment: Yes  Ivo Moga G. Garnette CzechSampson MSW, LCSWA 08/09/2016, 10:04 AM

## 2016-08-09 NOTE — [facility] Group Notes (Signed)
[facility] Group Notes:  (Nursing/MHT/Case Management/Adjunct)  Date:  08/09/2016  Time:  5:13 AM  Type of Therapy:  Psychoeducational Skills  Participation Level:  Active  Participation Quality:  Appropriate and Sharing  Affect:  Appropriate  Cognitive:  Appropriate  Insight:  Appropriate and Good  Engagement in Group:  Engaged  Modes of Intervention:  Discussion, Socialization and Support  Summary of Progress/Problems:  Laurie MilroyLaquanda Y Bashar Stevens 08/09/2016, 5:13 AM

## 2017-02-17 ENCOUNTER — Emergency Department (HOSPITAL_COMMUNITY)
Admission: EM | Admit: 2017-02-17 | Discharge: 2017-02-19 | Disposition: A | Discharge status: Home / Self Care | Payer: Self-pay | Attending: Emergency Medicine | Admitting: Emergency Medicine

## 2017-02-17 ENCOUNTER — Encounter (HOSPITAL_COMMUNITY): Payer: Self-pay | Admitting: Emergency Medicine

## 2017-02-17 DIAGNOSIS — F29 Unspecified psychosis not due to a substance or known physiological condition: Secondary | ICD-10-CM

## 2017-02-17 DIAGNOSIS — F3164 Bipolar disorder, current episode mixed, severe, with psychotic features: Secondary | ICD-10-CM

## 2017-02-17 NOTE — ED Triage Notes (Signed)
Patient arrives by EMS with complaints of found in the rain by bystanders and crying. Patient will not talk to EMS or [employer]. Patient became combative with EMS and they administered Versed 2.5 mg IM for sedation.

## 2017-02-17 NOTE — ED Notes (Signed)
Unable to finish pt's triage as pt will not answer questions.  Pt did allow RN to get vitals.  Pt follows commands but I am UTA if pt is A&O at this time.  Will continue to monitor.  VS stable.

## 2017-02-17 NOTE — ED Notes (Signed)
[facility] Expected date:  Expected time:  Means of arrival:  Comments: EMS-found in rain-Versed 2 mg IM for "outburst"

## 2017-02-18 ENCOUNTER — Encounter (HOSPITAL_COMMUNITY): Payer: Self-pay | Admitting: Emergency Medicine

## 2017-02-18 LAB — CBC WITH DIFFERENTIAL/PLATELET
BASOS ABS: 0.1 10*3/uL (ref 0.0–0.1)
Basophils Relative: 1 %
EOS ABS: 0.1 10*3/uL (ref 0.0–0.7)
EOS PCT: 1 %
HCT: 40.1 % (ref 36.0–46.0)
HEMOGLOBIN: 13.9 g/dL (ref 12.0–15.0)
LYMPHS ABS: 2.2 10*3/uL (ref 0.7–4.0)
Lymphocytes Relative: 25 %
MCH: 33 pg (ref 26.0–34.0)
MCHC: 34.7 g/dL (ref 30.0–36.0)
MCV: 95.2 fL (ref 78.0–100.0)
Monocytes Absolute: 1.1 10*3/uL — ABNORMAL HIGH (ref 0.1–1.0)
Monocytes Relative: 13 %
NEUTROS PCT: 60 %
Neutro Abs: 5.3 10*3/uL (ref 1.7–7.7)
PLATELETS: 236 10*3/uL (ref 150–400)
RBC: 4.21 MIL/uL (ref 3.87–5.11)
RDW: 11.9 % (ref 11.5–15.5)
WBC: 8.7 10*3/uL (ref 4.0–10.5)

## 2017-02-18 LAB — RAPID URINE DRUG SCREEN, HOSP PERFORMED
AMPHETAMINES: NOT DETECTED
BARBITURATES: NOT DETECTED
Benzodiazepines: POSITIVE — AB
COCAINE: NOT DETECTED
Opiates: NOT DETECTED
TETRAHYDROCANNABINOL: POSITIVE — AB

## 2017-02-18 LAB — COMPREHENSIVE METABOLIC PANEL
ALBUMIN: 4.1 g/dL (ref 3.5–5.0)
ALK PHOS: 49 U/L (ref 38–126)
ALT: 14 U/L (ref 14–54)
AST: 19 U/L (ref 15–41)
Anion gap: 9 (ref 5–15)
BUN: 8 mg/dL (ref 6–20)
CALCIUM: 9.1 mg/dL (ref 8.9–10.3)
CO2: 23 mmol/L (ref 22–32)
CREATININE: 0.63 mg/dL (ref 0.44–1.00)
Chloride: 109 mmol/L (ref 101–111)
GFR calc non Af Amer: >60 mL/min (ref >60)
GLUCOSE: 95 mg/dL (ref 65–99)
Potassium: 3.6 mmol/L (ref 3.5–5.1)
SODIUM: 141 mmol/L (ref 135–145)
Total Bilirubin: 0.8 mg/dL (ref 0.3–1.2)
Total Protein: 6.9 g/dL (ref 6.5–8.1)

## 2017-02-18 LAB — I-STAT BETA HCG BLOOD, ED (MC, WL, AP ONLY)

## 2017-02-18 LAB — SALICYLATE LEVEL: Salicylate Lvl: <7 mg/dL (ref 2.8–30.0)

## 2017-02-18 LAB — ETHANOL

## 2017-02-18 LAB — ACETAMINOPHEN LEVEL: Acetaminophen (Tylenol), Serum: <10 ug/mL — ABNORMAL LOW (ref 10–30)

## 2017-02-18 MED ORDER — OLANZAPINE 10 MG IM SOLR
10.0000 mg | Freq: Once | INTRAMUSCULAR | Status: AC
  Administered 2017-02-18: 10 mg via INTRAMUSCULAR
  Filled 2017-02-18: qty 10

## 2017-02-18 MED ORDER — ZIPRASIDONE MESYLATE 20 MG IM SOLR: 10.0000 mg | Freq: Two times a day (BID) | INTRAMUSCULAR | Status: DC | PRN

## 2017-02-18 MED ORDER — LORAZEPAM 1 MG PO TABS: 1.0000 mg | ORAL_TABLET | ORAL | Status: DC | PRN

## 2017-02-18 MED ORDER — QUETIAPINE FUMARATE 100 MG PO TABS
100.0000 mg | ORAL_TABLET | Freq: Every day | ORAL | Status: DC
  Filled 2017-02-18: qty 1

## 2017-02-18 MED ORDER — ASENAPINE MALEATE 5 MG SL SUBL
10.0000 mg | SUBLINGUAL_TABLET | Freq: Once | SUBLINGUAL | Status: AC
  Administered 2017-02-18: 10 mg via SUBLINGUAL
  Filled 2017-02-18: qty 2

## 2017-02-18 MED ORDER — QUETIAPINE FUMARATE 25 MG PO TABS: 25.0000 mg | ORAL_TABLET | Freq: Four times a day (QID) | ORAL | Status: DC | PRN

## 2017-02-18 MED ORDER — LORAZEPAM 2 MG/ML IJ SOLN
2.0000 mg | Freq: Once | INTRAMUSCULAR | Status: AC
  Administered 2017-02-18: 2 mg via INTRAMUSCULAR
  Filled 2017-02-18: qty 1

## 2017-02-18 MED ORDER — DIPHENHYDRAMINE HCL 50 MG/ML IJ SOLN
50.0000 mg | Freq: Once | INTRAMUSCULAR | Status: AC
  Administered 2017-02-18: 50 mg via INTRAMUSCULAR
  Filled 2017-02-18: qty 1

## 2017-02-18 MED ORDER — ZIPRASIDONE MESYLATE 20 MG IM SOLR
10.0000 mg | Freq: Once | INTRAMUSCULAR | Status: AC
  Administered 2017-02-18: 10 mg via INTRAMUSCULAR
  Filled 2017-02-18: qty 20

## 2017-02-18 MED ORDER — STERILE WATER FOR INJECTION IJ SOLN
INTRAMUSCULAR | Status: AC
  Administered 2017-02-18: 14:00:00
  Filled 2017-02-18: qty 10

## 2017-02-18 MED ORDER — STERILE WATER FOR INJECTION IJ SOLN
INTRAMUSCULAR | Status: AC
  Administered 2017-02-18: 08:00:00
  Filled 2017-02-18: qty 10

## 2017-02-18 MED ORDER — LAMOTRIGINE 25 MG PO TABS
25.0000 mg | ORAL_TABLET | Freq: Every day | ORAL | Status: DC
  Administered 2017-02-18 – 2017-02-19 (×2): 25 mg via ORAL
  Filled 2017-02-18 (×2): qty 1

## 2017-02-18 NOTE — ED Provider Notes (Signed)
[facility]-EMERGENCY DEPT Provider Note   CSN: [id number] Arrival date & time: 02/17/17  2225     History   Chief Complaint Chief Complaint  Patient presents with  . Upset and crying    HPI Laurie Stevens is a 26 y.o. female.  EMS called by bystanders after patient was found in the rain, crying and upset. She is not providing any history or talking at all to EMS, [employer] or emergency department personnel. She became combative, screaming, and was given Versed by EMS. Here she is non-verbal but calm and awake.    The history is provided by the EMS personnel.    Past Medical History:  Diagnosis Date  . 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 Pneumonia 05/04   RML    Patient Active Problem List   Diagnosis Date Noted  . Bipolar disorder, mixed (HCC) 08/05/2016  . Bipolar disorder, curr episode mixed, severe, with psychotic features (HCC) 03/05/2016  . Cannabis use disorder, mild, abuse 03/05/2016  . Abdominal bloating 12/02/2010  . Weight gain 12/02/2010  . Family history of celiac disease 12/02/2010  . EDEMA 01/17/2008  . CHEST PAIN 01/17/2008  . 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 No pertinent surgical history.  OB History    No data available       Home Medications    Prior to Admission medications   Medication Sig Start Date End Date Taking? Authorizing Provider  diclofenac sodium (VOLTAREN) 1 % GEL Apply 2 g topically 4 (four) times daily. For arthritic pain 03/09/16   Armandina StammerNwoko, Agnes I, NP  lamoTRIgine (LAMICTAL) 25 MG tablet Take 1 tablet (25 mg total) by mouth daily at 8 pm. For mood stabilization 03/09/16   Armandina StammerNwoko, Agnes I, NP  lamoTRIgine (LAMICTAL) 25 MG tablet Take 1 tablet (25 mg total) by mouth daily. 08/09/16   Darliss RidgelKapur, Aarti K, MD  loratadine (CLARITIN) 10 MG tablet Take 1 tablet (10 mg total) by mouth daily. (May purchase this medicine from over the counter at the  pharmacy): For allergies 03/09/16   Armandina StammerNwoko, Agnes I, NP  QUEtiapine (SEROQUEL) 100 MG tablet Take 1 tablet (100 mg total) by mouth at bedtime. 08/09/16   Darliss RidgelKapur, Aarti K, MD  QUEtiapine (SEROQUEL) 25 MG tablet Take 1 tablet (25 mg total) by mouth 4 (four) times daily as needed (anxiety). 08/09/16   Darliss RidgelKapur, Aarti K, MD    Family History Family History  Problem Relation Age of Onset  . Celiac disease Sister   . Thyroid disease Sister   . Thyroid disease Sister        autoimmune disease  . Diabetes type II Unknown   . Hypertension Unknown   . Hypertension Maternal Grandmother   . Cancer Maternal Grandmother   . Cancer - Cervical Maternal Grandmother   . Suicidality Maternal Grandfather   . Alcoholism Maternal Uncle     Social History Social History  Substance Use Topics  . Smoking status: Never Smoker  . Smokeless tobacco: Never Used  . Alcohol use No     Comment: rarely     Allergies   Lactose intolerance (gi)   Review of Systems Review of Systems  Unable to perform ROS: Psychiatric disorder     Physical Exam Updated Vital Signs BP 114/69 (BP Location: Right Arm)   Pulse 77   Temp 98.5 F (36.9 C) (Oral)   Resp 18  SpO2 96%   Physical Exam  Constitutional: She appears well-developed and well-nourished.  HENT:  Head: Atraumatic.  Neck: Normal range of motion.  Cardiovascular: Normal rate.   Pulmonary/Chest: Effort normal.  Neurological: She is alert. Coordination normal.  Patient is non-verbal. Avoids eye contact. Calm.   Skin: Skin is warm and dry.  Psychiatric: Her affect is inappropriate. She is withdrawn. She is noncommunicative.  Patient combative with EMS, calm on arrival here, later with escalating agitated behavior consisting of crawling on the floor with a sheet over her head, screaming, combative. Remains noncommunicative.     ED Treatments / Results  Labs (all labs ordered are listed, but only abnormal results are displayed) Labs Reviewed  CBC  WITH DIFFERENTIAL/PLATELET  COMPREHENSIVE METABOLIC PANEL  ACETAMINOPHEN LEVEL  ETHANOL  SALICYLATE LEVEL  RAPID URINE DRUG SCREEN, HOSP PERFORMED  I-STAT BETA HCG BLOOD, ED ([facility], [facility], [facility] ONLY)    EKG  EKG Interpretation None       Radiology No results found.  Procedures Procedures (including critical care time)  Medications Ordered in ED Medications  ziprasidone (GEODON) injection 10 mg (not administered)     Initial Impression / Assessment and Plan / ED Course  I have reviewed the triage vital signs and the nursing notes.  Pertinent labs & imaging results that were available during my care of the patient were reviewed by me and considered in my medical decision making (see chart for details).     Patient found in the rain by bystanders, crying, not responding verbally. EMS called. They reported she became combative and was sedated. Here, she is calm for a period of time before becoming agitated, screaming, irrational.   Geodon ordered for patient and staff safety. Labs pending. Chart reviewed and a history of psychosis documented. IVC petition initiated. Will medically clear and have TTS involved in care plan.   Final Clinical Impressions(s) / ED Diagnoses   Final diagnoses:  None   1. Acute psychosis  New Prescriptions New Prescriptions   No medications on file     Elpidio AnisUpstill, Arleigh Dicola, Cordelia Poche-C 02/21/17 2346    Shon BatonHorton, Courtney F, MD 02/22/17 [phone]

## 2017-02-18 NOTE — ED Notes (Signed)
Pt screaming out, will not answer questions.

## 2017-02-18 NOTE — ED Notes (Addendum)
Pt awake, 1:1 in place, pt is able to follow commands. Pt loud, screaming,eyes closed. Pt does not respond verbally to nursing staff on approach/when asked questions. Pt using hand gestures and stomping feet. Pt behavior difficult to redirect. This nurse notified Corliss ParishJameson,NP of pt behavior. Will continue to monitor on 1:1 while awake per MD order.

## 2017-02-18 NOTE — ED Notes (Signed)
Received report with pt in bathroom taking a shower, looking in the mirror independently, brushing her hair back with her eyes open.  Pt took shower without assistance, no fall noted.  Pt ambulated out of bathroom, shuffling her feet with her eyes closed.  RN & staff informed pt that cameras were in the bathroom, where her independent behavior was observed.  Pt then proceeded to open her eyes and speak in full sentences.  Pt states she felt caged in, in this department and could not get fresh air.  RN explained to pt that her behaviors were being documented and cooperative behavior and interaction would help her situation in reference to being discharged home.  Pt agreeable with that and is calm at the moment.

## 2017-02-18 NOTE — ED Notes (Signed)
Pt awake,  bizarre behavior, remains non verbal with staff when spoken too, Pt pushing alarm buttons in room, 1:1 in place. Will continue to monitor on 1:1 while awake per MD order.

## 2017-02-18 NOTE — ED Notes (Signed)
Bed: [id number] Expected date:  Expected time:  Means of arrival:  Comments: Hold for hal c

## 2017-02-18 NOTE — ED Notes (Signed)
Pt changed into purple scrubs and wanded.  Belongings labeled, placed behind nurse's station

## 2017-02-18 NOTE — Progress Notes (Signed)
02/18/2017 Approximately 2:25pm Staff told LRT that pt just went to sleep. No activities were offered.  Marvell Fullerachel Meyer, Recreation Therapy Intern  Caroll RancherMarjette Yoan Sallade, LRT/CTRS

## 2017-02-18 NOTE — BH Assessment (Signed)
Tele Assessment Note   Laurie Stevens is an 26 y.o. female.  -Clinician talked with Elpidio Anis, PA and Dr. Wilkie Aye about patient.  EMS called by bystanders after patient was found in the rain, crying and upset. She is not providing any history or talking at all to EMS, [employer] or emergency department personnel. She became combative, screaming, and was given Versed by EMS.  Dr. Wilkie Aye said that patient had been seen "scooting on the floor with sheet over her head."  Patient started yelling and screaming when she was redirected to her bed.  Patient's nurse, Hyacinth Meeker, reported that patient had been screaming hysterically and not speaking intelligible words.  Patient would not speak to Elpidio Anis, PA.  Patient was given geodon.  Patient was seen by this clinician about two hours after geodon administered.  Patient had been moved into WA24 and out of the hall bed.  Patient has head covered with sheet and will not speak to this clinician.  Patient was seen at [facility] on 08-04-16 by Robinette Haines.  He documented similar behavior but was able to get patient to respond to some questions.  Patient was admitted to [facility] at that time.  Patient was at [facility] in 03/2016.    -Clinician discussed patient care with Donell Sievert, PA who recommends inpatient care for stabilization.  Clinician discussed with Elpidio Anis, PA who said that patient had already been IVC'ed.  TTS to seek placement.  Diagnosis: Bipolar do current episode mixed, severe w/ psychotic features (F31.64); Cannabis use d/o mild (F12.10)  Past Medical History:  Past Medical History:  Diagnosis Date  . 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 Pneumonia 05/04   RML    History reviewed. No pertinent surgical history.  Family History:  Family History  Problem Relation Age of Onset  . Celiac disease Sister   . Thyroid disease Sister   . Thyroid disease Sister       autoimmune disease  . Diabetes type II Unknown   . Hypertension Unknown   . Hypertension Maternal Grandmother   . Cancer Maternal Grandmother   . Cancer - Cervical Maternal Grandmother   . Suicidality Maternal Grandfather   . Alcoholism Maternal Uncle     Social History:  reports that she has never smoked. She has never used smokeless tobacco. She reports that she uses drugs, including Marijuana. She reports that she does not drink alcohol.  Additional Social History:  Alcohol / Drug Use Pain Medications: Unknown Prescriptions: Unknown Over the Counter: Unknown History of alcohol / drug use?: Yes Substance #1 Name of Substance 1: Benzos (unknown) 1 - Age of First Use: UTA 1 - Amount (size/oz): UTA 1 - Frequency: UTA 1 - Duration: UTA 1 - Last Use / Amount: Pt positive for benzos on UDS Substance #2 Name of Substance 2: Marijuana 2 - Age of First Use: Unknown 2 - Amount (size/oz): Unknown 2 - Frequency: Unknown 2 - Duration: Unknown 2 - Last Use / Amount: Pt positive for THC on UDS  CIWA: CIWA-Ar BP: 114/69 Pulse Rate: 77 COWS:    PATIENT STRENGTHS: (choose at least two) Average or above average intelligence Capable of independent living  Allergies:  Allergies  Allergen Reactions  . Lactose Intolerance (Gi) Diarrhea    Home Medications:  (Not in a hospital admission)  OB/GYN Status:  No LMP recorded.  General Assessment Data Location of Assessment:  [facility] ED TTS Assessment: In system Is this a Tele or Face-to-Face Assessment?: Face-to-Face Is this an Initial Assessment or a Re-assessment for this encounter?: Initial Assessment Marital status: Other (comment) (UTA) Is patient pregnant?: Unknown Pregnancy Status: Unknown Living Arrangements: Alone (Unknown) Can pt return to current living arrangement?: Yes Admission Status: Involuntary Is patient capable of signing voluntary admission?: No Referral Source: Other ([facility]ought [facility] by [employer] [facility]) Insurance type: self  pay     Crisis Care Plan Living Arrangements: Alone (Unknown) Name of Psychiatrist: Unknown Name of Therapist: Unknown  Education Status Is patient currently in school?: No Highest grade of school patient has completed: Unknown  Risk to self with the past 6 months Suicidal Ideation: No Has patient been a risk to self within the past 6 months prior to admission? : Other (comment) (Unknown) Suicidal Intent: No Has patient had any suicidal intent within the past 6 months prior to admission? : No Is patient at risk for suicide?: No Suicidal Plan?: No Has patient had any suicidal plan within the past 6 months prior to admission? : Other (comment) (Unknown) Access to Means: No What has been your use of drugs/alcohol within the last 12 months?: THC & benzos in UDS Previous Attempts/Gestures: Yes How many times?: 3 Other Self Harm Risks: Active addition Triggers for Past Attempts: Unpredictable Intentional Self Injurious Behavior: None Family Suicide History: No Recent stressful life event(s): Turmoil (Comment) (Specifics are unknown) Persecutory voices/beliefs?: Yes Depression: Yes Depression Symptoms: Feeling angry/irritable Substance abuse history and/or treatment for substance abuse?:  (UTA) Suicide prevention information given to non-admitted patients: Not applicable  Risk to Others within the past 6 months Homicidal Ideation: No Does patient have any lifetime risk of violence toward others beyond the six months prior to admission? : Unknown Thoughts of Harm to Others: No Current Homicidal Intent: No Current Homicidal Plan: No Access to Homicidal Means: No Identified Victim: Unknown History of harm to others?: Yes Assessment of Violence: On admission Violent Behavior Description: Physically aggressive w/ hospital and EMS staff Does patient have access to weapons?: No Criminal Charges Pending?: No (Unknown) Does patient have a court date: No (Unknown) Is patient on  probation?: Unknown  Psychosis Hallucinations:  (Pt unable to answer but behaved as if internal stimuli prese) Delusions:  (Unknown)  Mental Status Report Appearance/Hygiene: Disheveled, In scrubs Eye Contact: Poor Motor Activity: Agitation, Hyperactivity, Gestures, Restlessness Speech: Argumentative, Incoherent (Pt yelling and screaming) Level of Consciousness: Drowsy Mood: Suspicious, Apprehensive, Helpless, Anxious (Pt had been yelling earlier) Affect: Anxious, Apprehensive, Preoccupied, Frightened, Irritable Anxiety Level: Panic Attacks Panic attack frequency: Unsure Most recent panic attack: Tonight Thought Processes: Unable to Assess Judgement: Unable to Assess Orientation: Unable to assess Obsessive Compulsive Thoughts/Behaviors: Unable to Assess  Cognitive Functioning Concentration: Unable to Assess Memory: Unable to Assess IQ: Average Insight: Unable to Assess Impulse Control: Unable to Assess Appetite:  (Unknown) Weight Loss: 0 Weight Gain: 0 Sleep: Unable to Assess Total Hours of Sleep:  (Unknown) Vegetative Symptoms: Unable to Assess  ADLScreening [facility] Assessment Services) Patient's cognitive ability adequate to safely complete daily activities?: Yes Patient able to express need for assistance with ADLs?: Yes Independently performs ADLs?: Yes (appropriate for developmental age)  Prior Inpatient Therapy Prior Inpatient Therapy: Yes Prior Therapy Dates: 07/2016; 03/2016 [facility] Reason for Treatment: psychotic behavior  Prior Outpatient Therapy Prior Outpatient Therapy: No Prior Therapy Dates: Unknown Prior Therapy Facilty/Provider(s): Unknown Reason for Treatment: Unknown Does patient have an ACCT team?: Unknown Does patient have Intensive  In-House Services?  : Unknown Does patient have [facility] services? : Unknown Does patient have P4CC services?: Unknown  ADL Screening (condition at time of admission) Patient's  cognitive ability adequate to safely complete daily activities?: Yes Is the patient deaf or have difficulty hearing?: No Does the patient have difficulty seeing, even when wearing glasses/contacts?: No Does the patient have difficulty concentrating, remembering, or making decisions?: Yes Patient able to express need for assistance with ADLs?: Yes Does the patient have difficulty dressing or bathing?: No Independently performs ADLs?: Yes (appropriate for developmental age) Does the patient have difficulty walking or climbing stairs?: No Weakness of Legs: None Weakness of Arms/Hands: None       Abuse/Neglect Assessment (Assessment to be complete while patient is alone) Physical Abuse: Denies (Unknown) Verbal Abuse: Denies (Unknown) Sexual Abuse: Denies (Unknown) Exploitation of patient/patient's resources: Denies Self-Neglect: Denies     Merchant navy officerAdvance Directives (For Healthcare) Does Patient Have a Medical Advance Directive?: No Would patient like information on creating a medical advance directive?: No - Patient declined    Additional Information 1:1 In Past 12 Months?: No CIRT Risk: Yes Elopement Risk: Yes Does patient have medical clearance?: Yes     Disposition:  Disposition Initial Assessment Completed for this Encounter: Yes Disposition of Patient: Inpatient treatment program, Referred to Type of inpatient treatment program: Adult Patient referred to: Other (Comment) (Pt is in need of inpatient care for stabilization)  Alexandria LodgeHarvey, Graciella Arment Ray 02/18/2017 4:03 AM

## 2017-02-18 NOTE — ED Notes (Addendum)
Pt climbed out of bed and starting scooting across floor on her bottom with sheet over her head.  Pt helped back into bed by staff.  Pt screaming hysterically but not speaking intelligible words.  PA Shari at bedside.  Pt will not speak to either PA or RN.  Attempted to draw blood from pt's line.  Pt screamed louder so IV was removed, during which pt wailed and screamed 'please' repeatedly.  Pt continues to refuse to speak to me.  When I asked pt if I could have her other arm for a blood draw she rolled away from me then gave me the middle finger.  PA Shari aware.

## 2017-02-18 NOTE — ED Notes (Signed)
This nurse in pt room to administer ordered IM medication. Security, [employer], Diane,RN, Eboni, RN present. Pt behavior bizarre, Pt loud, screaming, physically aggressive, combative, pt attempted to kick and bite this nurse. Hands on required to administer medication. Will continue to monitor.

## 2017-02-18 NOTE — ED Notes (Signed)
Pt transferred from main ed, pt shuffling feet..  Pt brought in after being found standing in rain, combative in ed, rolling around on the floor.  Pt calm at present, not interactive at present,  Not forthcoming with information.  Awake, alert & responsive, no distress noted.  Monitoring for safety, Q 15 min checks in effect.  Safety check for contraband completed, no items found.

## 2017-02-18 NOTE — BH Assessment (Addendum)
[facility] Assessment Progress Note  Per Thedore MinsMojeed Akintayo, MD, this pt requires psychiatric hospitalization.  Pt presents under IVC initiated by EDP Ross Marcusourtney Horton, MD.  At 14:01 this writer [facility] and [facility], authorization [id number] from 02/24/2017 - 03/02/2080.  Please note that authorization does not mean that pt has been accepted to the facility.  At 14:04 I called [facility] and spoke to Robinette, who accepted demographic information by telephone.  Ref[facility] then [facility].  At 14:28 Robinette confirmed receipt of referral.  Given pt's combative behavior in the ED, I requested that pt be considered for priority status.  As of this writing a final decision is pending.   Doylene Canninghomas Ieesha Abbasi, MA Triage Specialist [phone]   Addendum:  At 14:44 Chrissie NoaWilliam [facility] report [facility] pt is now on their wait list.  When asked if she is under priority status, he replies, "I don't see why not."  Doylene Canninghomas Brenisha Tsui, MA Triage Specialist [phone]

## 2017-02-18 NOTE — ED Notes (Signed)
Pt awake, bizarre behavior, loud, crying continuously, continues to refuse to respond to staff on approach, walking around unit with eyes closed, pt behavior difficult to redirect. 1:1 in place. Jorene MinorsJameson NP notified of pt behavior.

## 2017-02-18 NOTE — ED Notes (Addendum)
Pt rolling back and forth in bed under sheets and crying.  Will not tell me what is bothering her, continues to refuse to speak.

## 2017-02-18 NOTE — ED Notes (Addendum)
Pt loud, laughing inappropriately in room, will not communicate with this nurse, bizarre behavior, not able to redirect. Will continue to monitor.

## 2017-02-18 NOTE — ED Notes (Signed)
Geodon administered to pt.  Pt lying in bed in [facility], screaming out occasionally.  Security & sitter& RN on standby.

## 2017-02-19 DIAGNOSIS — F1290 Cannabis use, unspecified, uncomplicated: Secondary | ICD-10-CM

## 2017-02-19 DIAGNOSIS — Z811 Family history of alcohol abuse and dependence: Secondary | ICD-10-CM

## 2017-02-19 DIAGNOSIS — F3164 Bipolar disorder, current episode mixed, severe, with psychotic features: Secondary | ICD-10-CM

## 2017-02-19 DIAGNOSIS — F1910 Other psychoactive substance abuse, uncomplicated: Secondary | ICD-10-CM

## 2017-02-19 NOTE — ED Notes (Signed)
Pt alert/oriented x3, nad.  Written dc instructions reviewed with pt.  Pt encoraged to take her medications as directed, and follow up with her OP provider as soon as possible.  Pt also encouraged to return/seek treatment for any changes or concerns.  Pt verbalized understanding.

## 2017-02-19 NOTE — ED Notes (Signed)
Up to the bathroom, nad, remains polite and cooperative.

## 2017-02-19 NOTE — ED Notes (Addendum)
Pt ambulatory w/o difficulty to dc area with mHt, belongings returned afer leaving the area. Pt reports her sister is coming to pick her up.

## 2017-02-19 NOTE — [facility] Suicide Risk Assessment (Signed)
Suicide Risk Assessment  Discharge Assessment   [facility]   Principal Problem: Bipolar disorder, curr episode mixed, severe, with psychotic features [facility](HCC) Discharge Diagnoses:  Patient Active Problem List   Diagnosis Date Noted  . Bipolar disorder, curr episode mixed, severe, with psychotic features (HCC) [F31.64] 03/05/2016    Priority: High  . Bipolar disorder, mixed (HCC) [F31.60] 08/05/2016  . Cannabis use disorder, mild, abuse [F12.10] 03/05/2016  . Abdominal bloating [R14.0] 12/02/2010  . Weight gain [R63.5] 12/02/2010  . Family history of celiac disease [Z83.79] 12/02/2010  . EDEMA [R60.9] 01/17/2008  . CHEST PAIN [R07.9] 01/17/2008  . 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 She appears well-developed and well-nourished.  HENT:  Head: Normocephalic.  Neck: Normal range of motion.  Respiratory: Effort normal.  Musculoskeletal: Normal range of motion.  Neurological: She is alert and oriented to person, place, and time.  Psychiatric: She has a normal mood and affect. Her speech is normal and behavior is normal. Judgment and thought content normal. Cognition and memory are normal.    Review of Systems  Psychiatric/Behavioral: Positive for substance abuse.  All other systems reviewed and are negative.   Blood pressure 107/69, pulse (!) 52, temperature 97.9 F (36.6 C), temperature source Oral, resp. rate 20, SpO2 100 %.There is no height or weight on file to calculate BMI.  General Appearance: Casual  Eye Contact:  Good  Speech:  Normal Rate  Volume:  Normal  Mood:  Euthymic  Affect:  Congruent  Thought Process:  Coherent and Descriptions of Associations: Intact  Orientation:   Full (Time, Place, and Person)  Thought Content:  WDL and Logical  Suicidal Thoughts:  No  Homicidal Thoughts:  No  Memory:  Immediate;   Good Recent;   Good Remote;   Good  Judgement:  Fair  Insight:  Fair  Psychomotor Activity:  Normal  Concentration:  Concentration: Good and Attention Span: Good  Recall:  Good  Fund of Knowledge:  Good  Language:  Good  Akathisia:  No  Handed:  Right  AIMS (if indicated):     Assets:  Housing Leisure Time Physical Health Resilience Social Support  ADL's:  Intact  Cognition:  WNL  Sleep:        Mental Status Per Nursing Assessment::   On Admission:   crying and depression  Demographic Factors:  Adolescent or young adult and Caucasian  Loss Factors: NA  Historical Factors: NA  Risk Reduction Factors:   Sense of responsibility to family, Positive social support and Positive therapeutic relationship  Continued Clinical Symptoms:  None  Cognitive Features That Contribute To Risk:  None    Suicide Risk:  Minimal: No identifiable suicidal ideation.  Patients presenting with no risk factors but with morbid ruminations; may be classified as minimal risk based on the severity of the depressive symptoms    Plan Of Care/Follow-up recommendations:  Activity:  as tolerated Diet:  heart healthy diet  Annmargaret Decaprio, NP 02/19/2017, 10:28 AM

## 2017-02-19 NOTE — Discharge Instructions (Signed)
For your ongoing mental health needs, you are advised to follow up with [facility].  New and returning patients are seen at their walk-in clinic.  Walk-in hours are Monday - Friday from 8:00 am - 3:00 pm.  Walk-in patients are seen on a first come, first served basis.  Try to arrive as early as possible for he best chance of being seen the same day: ° °     [facility] °     [address] °     [place], [place] [address] °     [phone]

## 2017-02-19 NOTE — ED Notes (Signed)
Pt cooperative throughout the night, interactive with staff.  Answering questions appropriately. Ambulatory to BR, gait steady, without assist.

## 2017-02-19 NOTE — ED Notes (Signed)
Pt resting quietly, nad, polite and cooperative
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
## 2017-02-19 NOTE — BH Assessment (Signed)
[facility] Assessment Progress Note  Per Thedore MinsMojeed Akintayo, MD, this pt does not require psychiatric hospitalization at this time.  Pt presents under IVC initiated by EDP Ross Marcusourtney Horton, MD, which Dr Jannifer FranklinAkintayo has rescinded.  [facility].  This has been included in pt's discharge instructions.  Pt's nurse, Asher MuirJamie, has been notified.  Doylene Canninghomas Lyndzee Kliebert, MA Triage Specialist [phone]

## 2017-04-09 ENCOUNTER — Encounter: Payer: Self-pay | Admitting: Internal Medicine

## 2018-01-10 ENCOUNTER — Emergency Department (HOSPITAL_COMMUNITY)
Admission: EM | Admit: 2018-01-10 | Discharge: 2018-01-10 | Disposition: A | Discharge status: Home / Self Care | Payer: Self-pay | Attending: Emergency Medicine | Admitting: Emergency Medicine

## 2018-01-10 ENCOUNTER — Other Ambulatory Visit: Payer: Self-pay

## 2018-01-10 ENCOUNTER — Encounter (HOSPITAL_COMMUNITY): Payer: Self-pay | Admitting: Emergency Medicine

## 2018-01-10 DIAGNOSIS — Z79899 Other long term (current) drug therapy: Secondary | ICD-10-CM | POA: Insufficient documentation

## 2018-01-10 DIAGNOSIS — Y929 Unspecified place or not applicable: Secondary | ICD-10-CM | POA: Insufficient documentation

## 2018-01-10 DIAGNOSIS — T23231A Burn of second degree of multiple right fingers (nail), not including thumb, initial encounter: Secondary | ICD-10-CM | POA: Insufficient documentation

## 2018-01-10 DIAGNOSIS — Y93G3 Activity, cooking and baking: Secondary | ICD-10-CM | POA: Insufficient documentation

## 2018-01-10 DIAGNOSIS — Y999 Unspecified external cause status: Secondary | ICD-10-CM | POA: Insufficient documentation

## 2018-01-10 DIAGNOSIS — X118XXA Contact with other hot tap-water, initial encounter: Secondary | ICD-10-CM | POA: Insufficient documentation

## 2018-01-10 DIAGNOSIS — T300 Burn of unspecified body region, unspecified degree: Secondary | ICD-10-CM

## 2018-01-10 MED ORDER — BACITRACIN ZINC 500 UNIT/GM EX OINT
1.0000 "application " | TOPICAL_OINTMENT | Freq: Two times a day (BID) | CUTANEOUS | Status: DC
  Administered 2018-01-10: 1 via TOPICAL
  Filled 2018-01-10 (×2): qty 0.9

## 2018-01-10 MED ORDER — HYDROCODONE-ACETAMINOPHEN 5-325 MG PO TABS
1.0000 | ORAL_TABLET | Freq: Once | ORAL | Status: AC
  Administered 2018-01-10: 1 via ORAL
  Filled 2018-01-10: qty 1
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
## 2018-06-22 ENCOUNTER — Emergency Department (HOSPITAL_COMMUNITY)
Admission: EM | Admit: 2018-06-22 | Discharge: 2018-06-23 | Disposition: A | Discharge status: Other Acute Inpatient Hospital | Payer: Self-pay | Attending: Emergency Medicine | Admitting: Emergency Medicine

## 2018-06-22 ENCOUNTER — Encounter (HOSPITAL_COMMUNITY): Payer: Self-pay | Admitting: Emergency Medicine

## 2018-06-22 ENCOUNTER — Other Ambulatory Visit: Payer: Self-pay

## 2018-06-22 DIAGNOSIS — Y9389 Activity, other specified: Secondary | ICD-10-CM | POA: Insufficient documentation

## 2018-06-22 DIAGNOSIS — F3160 Bipolar disorder, current episode mixed, unspecified: Secondary | ICD-10-CM | POA: Insufficient documentation

## 2018-06-22 DIAGNOSIS — R45851 Suicidal ideations: Secondary | ICD-10-CM | POA: Insufficient documentation

## 2018-06-22 DIAGNOSIS — F10920 Alcohol use, unspecified with intoxication, uncomplicated: Secondary | ICD-10-CM | POA: Insufficient documentation

## 2018-06-22 DIAGNOSIS — T1491XA Suicide attempt, initial encounter: Secondary | ICD-10-CM | POA: Insufficient documentation

## 2018-06-22 DIAGNOSIS — Z7289 Other problems related to lifestyle: Secondary | ICD-10-CM | POA: Insufficient documentation

## 2018-06-22 DIAGNOSIS — Z79899 Other long term (current) drug therapy: Secondary | ICD-10-CM | POA: Insufficient documentation

## 2018-06-22 DIAGNOSIS — Y999 Unspecified external cause status: Secondary | ICD-10-CM | POA: Insufficient documentation

## 2018-06-22 DIAGNOSIS — Y92410 Unspecified street and highway as the place of occurrence of the external cause: Secondary | ICD-10-CM | POA: Insufficient documentation

## 2018-06-22 DIAGNOSIS — X810XXA Intentional self-harm by jumping or lying in front of motor vehicle, initial encounter: Secondary | ICD-10-CM | POA: Insufficient documentation

## 2018-06-22 DIAGNOSIS — R4689 Other symptoms and signs involving appearance and behavior: Secondary | ICD-10-CM | POA: Insufficient documentation

## 2018-06-22 DIAGNOSIS — K900 Celiac disease: Secondary | ICD-10-CM | POA: Insufficient documentation

## 2018-06-22 DIAGNOSIS — F1210 Cannabis abuse, uncomplicated: Secondary | ICD-10-CM | POA: Insufficient documentation

## 2018-06-22 HISTORY — DX: Bipolar disorder, unspecified: F31.9

## 2018-06-22 HISTORY — DX: Celiac disease: K90.0

## 2018-06-22 LAB — CBC WITH DIFFERENTIAL/PLATELET
Abs Immature Granulocytes: 0.07 10*3/uL (ref 0.00–0.07)
Basophils Absolute: 0.1 10*3/uL (ref 0.0–0.1)
Basophils Relative: 1 %
Eosinophils Absolute: 0.1 10*3/uL (ref 0.0–0.5)
Eosinophils Relative: 1 %
HCT: 42.1 % (ref 36.0–46.0)
Hemoglobin: 13.3 g/dL (ref 12.0–15.0)
IMMATURE GRANULOCYTES: 1 %
Lymphocytes Relative: 23 %
Lymphs Abs: 2.6 10*3/uL (ref 0.7–4.0)
MCH: 32.7 pg (ref 26.0–34.0)
MCHC: 31.6 g/dL (ref 30.0–36.0)
MCV: 103.4 fL — AB (ref 80.0–100.0)
Monocytes Absolute: 1 10*3/uL (ref 0.1–1.0)
Monocytes Relative: 9 %
NEUTROS ABS: 7.5 10*3/uL (ref 1.7–7.7)
NEUTROS PCT: 65 %
Platelets: 262 10*3/uL (ref 150–400)
RBC: 4.07 MIL/uL (ref 3.87–5.11)
RDW: 12.2 % (ref 11.5–15.5)
WBC: 11.4 10*3/uL — ABNORMAL HIGH (ref 4.0–10.5)
nRBC: 0 % (ref 0.0–0.2)

## 2018-06-22 LAB — COMPREHENSIVE METABOLIC PANEL
ALK PHOS: 50 U/L (ref 38–126)
ALT: 30 U/L (ref 0–44)
ANION GAP: 11 (ref 5–15)
AST: 22 U/L (ref 15–41)
Albumin: 4.2 g/dL (ref 3.5–5.0)
BUN: 8 mg/dL (ref 6–20)
CALCIUM: 8.6 mg/dL — AB (ref 8.9–10.3)
CO2: 21 mmol/L — ABNORMAL LOW (ref 22–32)
Chloride: 110 mmol/L (ref 98–111)
Creatinine, Ser: 0.79 mg/dL (ref 0.44–1.00)
GFR calc Af Amer: >60 mL/min (ref >60)
GFR calc non Af Amer: >60 mL/min (ref >60)
Glucose, Bld: 109 mg/dL — ABNORMAL HIGH (ref 70–99)
Potassium: 4.5 mmol/L (ref 3.5–5.1)
SODIUM: 142 mmol/L (ref 135–145)
Total Bilirubin: 0.6 mg/dL (ref 0.3–1.2)
Total Protein: 6.8 g/dL (ref 6.5–8.1)

## 2018-06-22 LAB — RAPID URINE DRUG SCREEN, HOSP PERFORMED
Amphetamines: NOT DETECTED
BARBITURATES: NOT DETECTED
Benzodiazepines: NOT DETECTED
COCAINE: NOT DETECTED
Opiates: NOT DETECTED
Tetrahydrocannabinol: POSITIVE — AB

## 2018-06-22 LAB — SALICYLATE LEVEL: Salicylate Lvl: <7 mg/dL (ref 2.8–30.0)

## 2018-06-22 LAB — ETHANOL: Alcohol, Ethyl (B): 184 mg/dL — ABNORMAL HIGH (ref <10)

## 2018-06-22 LAB — I-STAT BETA HCG BLOOD, ED (MC, WL, AP ONLY): I-stat hCG, quantitative: <5 m[IU]/mL (ref <5)

## 2018-06-22 LAB — ACETAMINOPHEN LEVEL: Acetaminophen (Tylenol), Serum: <10 ug/mL — ABNORMAL LOW (ref 10–30)

## 2018-06-22 MED ORDER — RISPERIDONE 1 MG PO TBDP
2.0000 mg | ORAL_TABLET | Freq: Three times a day (TID) | ORAL | Status: DC | PRN
  Administered 2018-06-22: 2 mg via ORAL
  Filled 2018-06-22: qty 2

## 2018-06-22 MED ORDER — LAMOTRIGINE 100 MG PO TABS
200.0000 mg | ORAL_TABLET | Freq: Every day | ORAL | Status: DC
  Administered 2018-06-22: 200 mg via ORAL
  Filled 2018-06-22: qty 2

## 2018-06-22 MED ORDER — LORAZEPAM 1 MG PO TABS: 1.0000 mg | ORAL_TABLET | ORAL | Status: DC | PRN

## 2018-06-22 MED ORDER — LORAZEPAM 1 MG PO TABS
1.0000 mg | ORAL_TABLET | Freq: Once | ORAL | Status: AC
  Administered 2018-06-22: 1 mg via ORAL
  Filled 2018-06-22: qty 1

## 2018-06-22 MED ORDER — ZIPRASIDONE MESYLATE 20 MG IM SOLR
20.0000 mg | INTRAMUSCULAR | Status: AC | PRN
  Administered 2018-06-22: 20 mg via INTRAMUSCULAR
  Filled 2018-06-22: qty 20

## 2018-06-22 MED ORDER — LORATADINE 10 MG PO TABS
10.0000 mg | ORAL_TABLET | Freq: Every day | ORAL | Status: DC | PRN
  Filled 2018-06-22: qty 1

## 2018-06-22 MED ORDER — STERILE WATER FOR INJECTION IJ SOLN
INTRAMUSCULAR | Status: AC
  Administered 2018-06-22: 2 mL
  Filled 2018-06-22: qty 10

## 2018-06-22 MED ORDER — BUSPIRONE HCL 5 MG PO TABS
7.5000 mg | ORAL_TABLET | Freq: Three times a day (TID) | ORAL | Status: DC
  Administered 2018-06-22: 21:00:00 via ORAL
  Administered 2018-06-22: 7.5 mg via ORAL
  Filled 2018-06-22 (×2): qty 1.5

## 2018-06-22 NOTE — ED Notes (Signed)
On admission to the Acute Unit pt is calm and cooperative, but guarded, soft spoken. Appears depressed. Denies SI.
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
## 2018-06-22 NOTE — BH Assessment (Signed)
[facility]  Per Juanetta BeetsJacqueline Norman, DO, this pt requires psychiatric hospitalization.  Berneice Heinrichina Tate, RN, [facility]; [facility] will be ready to receive pt at 21:00.  Pt presents under IVC initiated by EDP Dione Boozeavid Glick, MD, and [facility].  Pt's nurse, Diane, has been notified, and agrees to call report to [phone].  Pt is to be transported via [employer].   Doylene Canninghomas Jasmine Mcbeth, [place] Behavioral Health Coordinator [phone]

## 2018-06-22 NOTE — ED Notes (Signed)
Pt alert and oriented. Pt denies any pain or discomfort at this time. Pt calm and cooperative. Pt denies si, hi, and avh. Pt verbal[facility] an [facility]. Pt safe will continue to monitor.

## 2018-06-22 NOTE — ED Notes (Signed)
[facility] Expected date:  Expected time:  Means of arrival:  Comments: Hold for 27

## 2018-06-22 NOTE — ED Notes (Signed)
Pt's sister visited and asked this Clinical research associatewriter for information about her sister who is our pt. This Clinical research associatewriter explained that pt will need to give written consent to give information. Pt was ambivalent about given written consent because she was paranoid about the process. She is more focused on feeling that it is unfair that she has to go inpatient and feels that she needs to talk to the psychiatrist again about it. This Clinical research associatewriter explained that she is under IVC and that the psychiatrist is gone for the day. After it was apparent that pt was not going to give written consent, this writer went back out into the hallway where her sister was waiting. Pt came out into the hall way upset, and her sister then became upset too. Demanded to know this writer's name and said that she would report this writer for not talking to her about her sister. Security assister pt's sister off of the unit because she was increasing the current chaos. Charge nurse Tim notified and he is talking with the family member at this time. Pt's sister was not given any information about pt because pt would not sign consent.
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
## 2018-06-22 NOTE — ED Notes (Signed)
Pt allowed to use her Cell phone to get numbers out of her contact list, but instead of getting the numbers, she tried to use Snap Chat.  She became argumentative. This resulted in her not getting her numbers.

## 2018-06-22 NOTE — ED Notes (Addendum)
Pt's contact--- Lenis NoonShannon Faraci (sister): tel # [phone]

## 2018-06-22 NOTE — ED Notes (Signed)
PT MOVED TO [facility] WITHOUT EVENT.
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
## 2018-06-23 ENCOUNTER — Other Ambulatory Visit: Payer: Self-pay

## 2018-06-23 ENCOUNTER — Encounter (HOSPITAL_COMMUNITY): Payer: Self-pay | Admitting: *Deleted

## 2018-06-23 ENCOUNTER — Inpatient Hospital Stay (HOSPITAL_COMMUNITY)
Admission: AD | Admit: 2018-06-23 | Discharge: 2018-06-24 | DRG: 885 | Disposition: A | Discharge status: Home / Self Care | Payer: Federal, State, Local not specified - Other | Attending: Psychiatry | Admitting: Psychiatry

## 2018-06-23 DIAGNOSIS — Z818 Family history of other mental and behavioral disorders: Secondary | ICD-10-CM | POA: Diagnosis not present

## 2018-06-23 DIAGNOSIS — F17210 Nicotine dependence, cigarettes, uncomplicated: Secondary | ICD-10-CM | POA: Diagnosis present

## 2018-06-23 DIAGNOSIS — K8681 Exocrine pancreatic insufficiency: Secondary | ICD-10-CM | POA: Diagnosis present

## 2018-06-23 DIAGNOSIS — E739 Lactose intolerance, unspecified: Secondary | ICD-10-CM | POA: Diagnosis present

## 2018-06-23 DIAGNOSIS — F1290 Cannabis use, unspecified, uncomplicated: Secondary | ICD-10-CM | POA: Diagnosis present

## 2018-06-23 DIAGNOSIS — Z79899 Other long term (current) drug therapy: Secondary | ICD-10-CM

## 2018-06-23 DIAGNOSIS — Y906 Blood alcohol level of 120-199 mg/100 ml: Secondary | ICD-10-CM | POA: Diagnosis present

## 2018-06-23 DIAGNOSIS — F332 Major depressive disorder, recurrent severe without psychotic features: Principal | ICD-10-CM | POA: Diagnosis present

## 2018-06-23 DIAGNOSIS — K900 Celiac disease: Secondary | ICD-10-CM | POA: Diagnosis present

## 2018-06-23 DIAGNOSIS — F419 Anxiety disorder, unspecified: Secondary | ICD-10-CM | POA: Diagnosis present

## 2018-06-23 DIAGNOSIS — Z8379 Family history of other diseases of the digestive system: Secondary | ICD-10-CM | POA: Diagnosis not present

## 2018-06-23 MED ORDER — LAMOTRIGINE 100 MG PO TABS
200.0000 mg | ORAL_TABLET | Freq: Every day | ORAL | Status: DC
  Administered 2018-06-24: 200 mg via ORAL
  Filled 2018-06-23 (×2): qty 1
  Filled 2018-06-23: qty 2
  Filled 2018-06-23: qty 14

## 2018-06-23 MED ORDER — ALUM & MAG HYDROXIDE-SIMETH 200-200-20 MG/5ML PO SUSP: 30.0000 mL | ORAL | Status: DC | PRN

## 2018-06-23 MED ORDER — HYDROXYZINE HCL 25 MG PO TABS
25.0000 mg | ORAL_TABLET | Freq: Three times a day (TID) | ORAL | Status: DC | PRN
  Administered 2018-06-24: 25 mg via ORAL
  Filled 2018-06-23: qty 1
  Filled 2018-06-23: qty 10

## 2018-06-23 MED ORDER — MAGNESIUM HYDROXIDE 400 MG/5ML PO SUSP: 30.0000 mL | Freq: Every day | ORAL | Status: DC | PRN

## 2018-06-23 MED ORDER — FOLIC ACID 1 MG PO TABS
1.0000 mg | ORAL_TABLET | Freq: Every day | ORAL | Status: DC
  Administered 2018-06-23 – 2018-06-24 (×2): 1 mg via ORAL
  Filled 2018-06-23 (×5): qty 1

## 2018-06-23 MED ORDER — RISPERIDONE 2 MG PO TBDP: 2.0000 mg | ORAL_TABLET | Freq: Three times a day (TID) | ORAL | Status: DC | PRN

## 2018-06-23 MED ORDER — VITAMIN B-1 100 MG PO TABS
100.0000 mg | ORAL_TABLET | Freq: Every day | ORAL | Status: DC
  Administered 2018-06-23 – 2018-06-24 (×2): 100 mg via ORAL
  Filled 2018-06-23 (×5): qty 1

## 2018-06-23 MED ORDER — CHLORDIAZEPOXIDE HCL 25 MG PO CAPS: 25.0000 mg | ORAL_CAPSULE | Freq: Four times a day (QID) | ORAL | Status: DC | PRN

## 2018-06-23 MED ORDER — INFLUENZA VAC SPLIT QUAD 0.5 ML IM SUSY
0.5000 mL | PREFILLED_SYRINGE | INTRAMUSCULAR | Status: DC
  Filled 2018-06-23: qty 0.5

## 2018-06-23 MED ORDER — TRAZODONE HCL 50 MG PO TABS
50.0000 mg | ORAL_TABLET | Freq: Every evening | ORAL | Status: DC | PRN
  Filled 2018-06-23: qty 7

## 2018-06-23 MED ORDER — ACETAMINOPHEN 325 MG PO TABS: 650.0000 mg | ORAL_TABLET | Freq: Four times a day (QID) | ORAL | Status: DC | PRN

## 2018-06-23 MED ORDER — LAMOTRIGINE 100 MG PO TABS
100.0000 mg | ORAL_TABLET | Freq: Every day | ORAL | Status: DC
  Administered 2018-06-23: 100 mg via ORAL
  Filled 2018-06-23 (×3): qty 1

## 2018-06-23 MED ORDER — LAMOTRIGINE 100 MG PO TABS
100.0000 mg | ORAL_TABLET | Freq: Once | ORAL | Status: AC
  Administered 2018-06-23: 100 mg via ORAL
  Filled 2018-06-23: qty 1

## 2018-06-23 MED ORDER — LORAZEPAM 1 MG PO TABS: 1.0000 mg | ORAL_TABLET | ORAL | Status: DC | PRN
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
## 2018-06-23 NOTE — Plan of Care (Signed)
  Problem: Activity: Goal: Interest or engagement in activities will improve Outcome: Progressing   Problem: Safety: Goal: Periods of time without injury will increase Outcome: Progressing  DAR NOTE: Patient presents with flat affect and depressed mood.  Denies suicidal thoughts, auditory and visual hallucinations.  Rates depression at 1, hopelessness at 0, and anxiety at 0.  Maintained on routine safety checks.  Medications given as prescribed.  Support and encouragement offered as needed.  Attended group and participated.  States goal for today is "go home as soon as possible."  Patient visible in milieu with minimal interaction.   Patient is safe on and off the unit.

## 2018-06-23 NOTE — ED Notes (Signed)
Pt transferred to bhh via gpd. Pt calm and cooperative, belongings given to gpd. [facility]
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
## 2018-06-23 NOTE — [facility] Suicide Risk Assessment (Addendum)
[facility] INPATIENT:  Family/Significant Other Suicide Prevention Education  Suicide Prevention Education:  Education Completed; Laurie HerrlichShannon Colson, sister [phone]) has been identified by the patient as the family member/significant other with whom the patient will be residing, and identified as the person(s) who will aid the patient in the event of a mental health crisis (suicidal ideations/suicide attempt).  With written consent from the patient, the family member/significant other has been provided the following suicide prevention education, prior to the and/or following the discharge of the patient.  The suicide prevention education provided includes the following:  Suicide risk factors  Suicide prevention and interventions  National Suicide Hotline telephone number  [facility] assessment telephone number  [facility] Emergency Assistance 911  [facility] and/or Residential Mobile Crisis Unit telephone number  Request made of family/significant other to:  Remove weapons (e.g., guns, rifles, knives), all items previously/currently identified as safety concern.    Remove drugs/medications (over-the-counter, prescriptions, illicit drugs), all items previously/currently identified as a safety concern.  The family member/significant other verbalizes understanding of the suicide prevention education information provided.  The family member/significant other agrees to remove the items of safety concern listed above.  Laurie Stevens 06/23/2018, 3:58 PM

## 2018-06-24 MED ORDER — TRAZODONE HCL 50 MG PO TABS: 50.0000 mg | ORAL_TABLET | Freq: Every evening | ORAL | 0 refills | Status: AC | PRN

## 2018-06-24 MED ORDER — LORATADINE 10 MG PO TABS: 10.0000 mg | ORAL_TABLET | Freq: Every day | ORAL | 0 refills | Status: AC

## 2018-06-24 MED ORDER — HYDROXYZINE HCL 25 MG PO TABS: 25.0000 mg | ORAL_TABLET | Freq: Three times a day (TID) | ORAL | 0 refills | Status: AC | PRN

## 2018-06-24 MED ORDER — LAMOTRIGINE 200 MG PO TABS: 200.0000 mg | ORAL_TABLET | Freq: Every day | ORAL | 0 refills | Status: AC

## 2018-06-24 MED ORDER — ONDANSETRON HCL 4 MG PO TABS
4.0000 mg | ORAL_TABLET | Freq: Once | ORAL | Status: AC
  Administered 2018-06-24: 4 mg via ORAL
  Filled 2018-06-24 (×2): qty 1

## 2018-06-24 NOTE — Progress Notes (Signed)
Pt was observed coloring in the dayroom. Pt appears anxious in affect and mood. Pt brightens on approach. Pt denies SI/HI/AVH/Pain at this time. PRNs offered; Pt accepted vistaril. Support and encouragement given. Will continue with POC.

## 2018-06-24 NOTE — Progress Notes (Signed)
Pt completed daily assessment and on this she wrote she  Denied SI today and she rated her depression, hopelessness and anxiety " 0/0/0/", respectively. She is given her dc instructions from this Clinical research associatewriter and she verbalizes understanding as cc are given to her ( SRA, AVS, SSP and transition record). Allbelongigns are returned to her and she is escorted to bldg entrance and dc'd .

## 2018-06-24 NOTE — Discharge Summary (Signed)
Physician Discharge Summary Note  Patient:  Laurie Stevens is an 27 y.o., female  MRN:  [id number]  DOB:  06-23-1991  Patient phone:  [phone] (home)   Patient address:   [place] [address],   Total Time spent with patient: Greater than 30 minutes  Date of Admission:  06/23/2018  Date of Discharge: 06-24-18  Reason for Admission: Mood stabilization treatment.  Principal Problem: MDD (major depressive disorder), recurrent severe, without psychosis [facility])  Discharge Diagnoses: Patient Active Problem List   Diagnosis Date Noted  . MDD (major depressive disorder), recurrent severe, without psychosis ([place]) [F33.2] 06/23/2018    Priority: High  . Bipolar disorder, mixed ([place]) [F31.60] 08/05/2016  . Bipolar disorder, curr episode mixed, severe, with psychotic features ([place]) [F31.64] 03/05/2016  . Cannabis use disorder, mild, abuse [F12.10] 03/05/2016  . Abdominal bloating [R14.0] 12/02/2010  . Weight gain [R63.5] 12/02/2010  . Family history of celiac disease [Z83.79] 12/02/2010  . EDEMA [R60.9] 01/17/2008  . CHEST PAIN [R07.9] 01/17/2008  . 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 Bipolar 1 disorder ([place])   . Celiac disease   . HSP (Henoch Schonlein purpura) ([place]) 05/99   hs no renal involvement hosp with abd pain [place] rx  with prednisone  . Pneumonia 05/04   RML   History reviewed. No pertinent surgical history. Family History:  Family History  Problem Relation Age of Onset  . Celiac disease Sister   . Thyroid disease Sister   . Thyroid disease Sister        autoimmune disease  . Diabetes type II Unknown   . Hypertension Unknown   . Hypertension Maternal Grandmother   . Cancer Maternal Grandmother   . Cancer - Cervical Maternal Grandmother   . Suicidality Maternal Grandfather   . Alcoholism Maternal Uncle    Family Psychiatric   History: See H&P  Social History:  Social History   Substance and Sexual Activity  Alcohol Use Yes  . Alcohol/week: 0.0 standard drinks   Comment: rarely     Social History   Substance and Sexual Activity  Drug Use Yes  . Frequency: 1.0 times per week  . Types: Marijuana   Comment: Episodic    Social History   Socioeconomic History  . Marital status: Single    Spouse name: Not on file  . Number of children: Not on file  . Years of education: Not on file  . Highest education level: Not on file  Occupational History  . Not on file  Social Needs  . Financial resource strain: Not on file  . Food insecurity:    Worry: Not on file    Inability: Not on file  . Transportation needs:    Medical: Not on file    Non-medical: Not on file  Tobacco Use  . Smoking status: Current Some Day Smoker    Packs/day: 0.25    Types: Cigarettes  . Smokeless tobacco: Never Used  Substance and Sexual Activity  . Alcohol use: Yes    Alcohol/week: 0.0 standard drinks    Comment: rarely  . Drug use: Yes    Frequency: 1.0 times per week    Types: Marijuana    Comment: Episodic  . Sexual activity: Not Currently  Lifestyle  . Physical activity:    Days per week: Not on file    Minutes per session: Not on file  .  Stress: Not on file  Relationships  . Social connections:    Talks on phone: Not on file    Gets together: Not on file    Attends religious service: Not on file    Active member of club or organization: Not on file    Attends meetings of clubs or organizations: Not on file    Relationship status: Not on file  Other Topics Concern  . Not on file  Social History Narrative   Pt lives with her sister in [place].  According to notes, Pt does voiceover work.         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 emergency department accompanied by [facility] police. The patient had been with a friend and was in the middle of [place] when the friend "abandoned me". The patient then got upset and was standing in the middle the road because the fact that she did not know what she was going to do. She was brought to the emergency room under involuntary commitment. The patient stated that she had had a fight with the friend, and that he just left her while they were at the [place]. She admitted that she did walk into traffic because she felt as though her month was going so bad that "why should I even be here". She was not acutely suicidal, and left traffic. In our interview today she admitted to marijuana usage, but no other substance abuse. Unfortunately her blood alcohol in the admission was 184. She stated she had been doing well prior to this. She had been living in [place], but moved in with her sister who lives in [place]. 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 She denied any suicidal ideation to me. She denied any current symptoms of mania. She was upset over the fact that she been held in the emergency room, and she also felt as though her sister was mistreated when she came to visit her in the emergency room. She was admitted to the hospital for evaluation and stabilization.  Laurie Stevens was re-admitted to the [facility] hospital for mood stabilization treatment after getting upset after a fight with a friend who abandoned her at the [place]. She has history of Bipolar depression. Her UDS test reports was positive for THC & a BAL of 184 per toxicology reports. However, she was not presenting with any substance withdrawal symptoms but  was started on a prn Librium capsule 25 mg as a cautionary measure in the event that she develops substance withdrawal symptoms. Besides the prn Librium 25 mg capsule, Laurie Stevens was also medicated & discharged on; Lamictal 200 mg for mood stabilization, Vistaril 25 mg prn for anxiety & Trazodone 50 mg prn for insomnia. Her other pre-existing medical problems were identified & treated by resuming her on all pertinent home medications for those health issues. She was enrolled & participated in the group counseling sessions being offered & held on this unit. This is for her to learn coping skills that should  help her cope better to maintain mood stability after discharge.  This is a brief hospital stay for Laurie Stevens this time around. During her follow-up care this morning with her attending psychiatrist, she asked to be discharged to her place of resident. She says she is doing well & no longer upset, depressed or suicidal. She denies any new issues or concerns. She presents both mentally & medically stable. She denies suicidal/homicidal ideations, auditory/visual/tactile hallucinations, delusional thoughts or paranoia. She is in agreement to continue mental health care on an outpatient basis as noted below. She was able to engage in safety planning including plan to return to [facility] or contact emergency services if she feels unable to maintain her own safety or the safety of others. Pt had no further questions, comments or concerns. And because there are no obvious clinical criteria to keep patient admitted to the hospital, she is discharged as requested. She received from the [facility], a 7 days worth supply samples of her [facility] discharge medications. She left [facility] with all belongings in no distress.   Physical Findings: AIMS: Facial and Oral Movements Muscles of Facial Expression: None, normal Lips and Perioral Area: None, normal Jaw: None, normal Tongue: None, normal,Extremity Movements Upper (arms, wrists, hands,  fingers): None, normal Lower (legs, knees, ankles, toes): None, normal, Trunk Movements Neck, shoulders, hips: None, normal, Overall Severity Severity of abnormal movements (highest score from questions above): None, normal Incapacitation due to abnormal movements: None, normal Patient's awareness of abnormal movements (rate only patient's report): No Awareness, Dental Status Current problems with teeth and/or dentures?: No Does patient usually wear dentures?: No  CIWA:    COWS:     Musculoskeletal: Strength & Muscle Tone: within normal limits Gait & Station: normal Patient leans: N/A  Psychiatric Specialty Exam: Physical Exam  Nursing note and vitals reviewed. Constitutional: She appears well-developed.  HENT:  Head: Normocephalic.  Eyes: Pupils are equal, round, and reactive to light.  Cardiovascular: Normal rate.  Respiratory: Effort normal.  GI: Soft.  Genitourinary:  Genitourinary Comments: Deffered  Musculoskeletal: Normal range of motion.  Neurological: She is alert.  Skin: Skin is warm.    Review of Systems  Constitutional: Negative.   HENT: Negative.   Eyes: Negative.   Respiratory: Negative.  Negative for cough and shortness of breath.   Cardiovascular: Negative.  Negative for chest pain and palpitations.  Gastrointestinal: Negative.  Negative for abdominal pain, heartburn, nausea and vomiting.  Genitourinary: Negative.   Musculoskeletal: Negative.   Skin: Negative.   Neurological: Negative.  Negative for dizziness and headaches.  Endo/Heme/Allergies: Negative.   Psychiatric/Behavioral: Positive for depression (Stable) and substance abuse (Hx, cannabis abuse). Negative for hallucinations, memory loss and suicidal ideas. The patient has insomnia (Stable). The patient is not nervous/anxious (Stable).     Blood pressure (!) 130/103, pulse 92, temperature 98.3 F (36.8 C), temperature source Oral, resp. rate 18, height 5' 6"  (1.676 m), weight 74.4 kg.Body mass  index is 26.47 kg/m.  See Md's discharge SRA   Have you used any form of tobacco in the last 30 days? (Cigarettes, Smokeless Tobacco, Cigars, and/or Pipes): Yes  Has this patient used any form of tobacco in the last 30 days? (Cigarettes, Smokeless Tobacco, Cigars, and/or Pipes): No  Blood Alcohol level:  Lab Results  Component Value Date   ETH 184 (H) 06/22/2018   ETH <5 24/58/0998   Metabolic Disorder Labs:  Lab Results  Component Value Date   HGBA1C 4.7 (L) 08/06/2016   MPG 88  08/06/2016   MPG 94 03/06/2016   Lab Results  Component Value Date   PROLACTIN 34.2 (H) 08/06/2016   PROLACTIN 75.6 (H) 03/06/2016   Lab Results  Component Value Date   CHOL 125 08/06/2016   TRIG 66 08/06/2016   HDL 42 08/06/2016   CHOLHDL 3.0 08/06/2016   VLDL 13 08/06/2016   LDLCALC 70 08/06/2016   LDLCALC 75 03/06/2016   See Psychiatric Specialty Exam and Suicide Risk Assessment completed by Attending Physician prior to discharge.  Discharge destination:  Home  Is patient on multiple antipsychotic therapies at discharge:  No   Has Patient had three or more failed trials of antipsychotic monotherapy by history:  No  Recommended Plan for Multiple Antipsychotic Therapies: NA  Allergies as of 06/24/2018      Reactions   Lactose Intolerance (gi) Diarrhea      Medication List    STOP taking these medications   busPIRone 7.5 MG tablet Commonly known as:  BUSPAR     TAKE these medications     Indication  hydrOXYzine 25 MG tablet Commonly known as:  ATARAX/VISTARIL Take 1 tablet (25 mg total) by mouth 3 (three) times daily as needed for anxiety.  Indication:  Feeling Anxious   lamoTRIgine 200 MG tablet Commonly known as:  LAMICTAL Take 1 tablet (200 mg total) by mouth daily. For mood stabilization Start taking on:  06/25/2018 What changed:    medication strength  how much to take  additional instructions  Another medication with the same name was removed. Continue taking  this medication, and follow the directions you see here.  Indication:  Mood stabilization   loratadine 10 MG tablet Commonly known as:  CLARITIN Take 1 tablet (10 mg total) by mouth daily. (May purchase this medicine from over the counter at the pharmacy): For allergies  Indication:  Perennial Allergic Rhinitis, Hayfever   traZODone 50 MG tablet Commonly known as:  DESYREL Take 1 tablet (50 mg total) by mouth at bedtime as needed for sleep.  Indication:  Trouble Sleeping      Follow-up [facility]. Go on 07/01/2018.   Why:  Your next hospital follow up appointment is Friday, 07/01/18 at 8:30a.  Please bring: photo ID, proof of insurance, social security card, and any discharge paperwork from this hospitalization.  Contact information: [address] [place] [address] [phone]          Follow-up recommendations: Activity:  As tolerated Diet: As recommended by your primary care doctor. Keep all scheduled follow-up appointments as recommended.   Comments: Patient is instructed prior to discharge to: Take all medications as prescribed by his/her mental healthcare provider. Report any adverse effects and or reactions from the medicines to his/her outpatient provider promptly. Patient has been instructed & cautioned: To not engage in alcohol and or illegal drug use while on prescription medicines. In the event of worsening symptoms, patient is instructed to call the crisis hotline, 911 and or go to the nearest ED for appropriate evaluation and treatment of symptoms. To follow-up with his/her primary care provider for your other medical issues, concerns and or health care needs.   Signed: Lindell Spar, NP, PMHNP, FNP-BC 06/24/2018, 10:10 AM

## 2018-06-24 NOTE — Tx Team (Signed)
Interdisciplinary Treatment and Diagnostic Plan Update  06/24/2018 Time of Session:  Glendora ScoreKelly D Stevens MRN: [id number]  Principal Diagnosis: MDD (major depressive disorder), recurrent severe, without psychosis (HCC)  Secondary Diagnoses: Principal Problem:   MDD (major depressive disorder), recurrent severe, without psychosis (HCC)   Current Medications:  Current Facility-Administered Medications  Medication Dose Route Frequency Provider Last Rate Last Dose  . acetaminophen (TYLENOL) tablet 650 mg  650 mg Oral Q6H PRN Laveda AbbeParks, Laurie Britton, NP      . alum & mag hydroxide-simeth (MAALOX/MYLANTA) 200-200-20 MG/5ML suspension 30 mL  30 mL Oral Q4H PRN Laveda AbbeParks, Laurie Britton, NP      . chlordiazePOXIDE (LIBRIUM) capsule 25 mg  25 mg Oral QID PRN Antonieta Pertlary, Greg Lawson, MD      . folic acid (FOLVITE) tablet 1 mg  1 mg Oral Daily Antonieta Pertlary, Greg Lawson, MD   1 mg at 06/24/18 [id number]  . hydrOXYzine (ATARAX/VISTARIL) tablet 25 mg  25 mg Oral TID PRN Laveda AbbeParks, Laurie Britton, NP   25 mg at 06/24/18 0005  . Influenza vac split quadrivalent PF (FLUARIX) injection 0.5 mL  0.5 mL Intramuscular Tomorrow-1000 Antonieta Pertlary, Greg Lawson, MD      . lamoTRIgine (LAMICTAL) tablet 200 mg  200 mg Oral Daily Antonieta Pertlary, Greg Lawson, MD   200 mg at 06/24/18 0810  . risperiDONE (RISPERDAL M-TABS) disintegrating tablet 2 mg  2 mg Oral Q8H PRN Laveda AbbeParks, Laurie Britton, NP       And  . LORazepam (ATIVAN) tablet 1 mg  1 mg Oral PRN Laveda AbbeParks, Laurie Britton, NP      . magnesium hydroxide (MILK OF MAGNESIA) suspension 30 mL  30 mL Oral Daily PRN Laveda AbbeParks, Laurie Britton, NP      . thiamine (VITAMIN B-1) tablet 100 mg  100 mg Oral Daily Antonieta Pertlary, Greg Lawson, MD   100 mg at 06/24/18 0811  . traZODone (DESYREL) tablet 50 mg  50 mg Oral QHS PRN Laveda AbbeParks, Laurie Britton, NP       PTA Medications: Medications Prior to Admission  Medication Sig Dispense Refill Last Dose  . busPIRone (BUSPAR) 7.5 MG tablet Take 7.5 mg by mouth 3 (three) times daily.   06/21/2018 at  Unknown time  . lamoTRIgine (LAMICTAL) 200 MG tablet Take 200 mg by mouth daily.   06/21/2018 at Unknown time  . lamoTRIgine (LAMICTAL) 25 MG tablet Take 1 tablet (25 mg total) by mouth daily. (Patient not taking: Reported on 06/22/2018) 30 tablet 1 Not Taking at Unknown time  . [DISCONTINUED] loratadine (CLARITIN) 10 MG tablet Take 1 tablet (10 mg total) by mouth daily. (May purchase this medicine from over the counter at the pharmacy): For allergies (Patient not taking: Reported on 06/22/2018) 30 tablet 0 Not Taking at Unknown time    Patient Stressors: Other: fight with friend  Patient Strengths: Ability for insight Average or above average intelligence Capable of independent living General fund of knowledge Motivation for treatment/growth Supportive family/friends  Treatment Modalities: Medication Management, Group therapy, Case management,  1 to 1 session with clinician, Psychoeducation, Recreational therapy.   Physician Treatment Plan for Primary Diagnosis: MDD (major depressive disorder), recurrent severe, without psychosis (HCC) Long Term Goal(s): Improvement in symptoms so as ready for discharge Improvement in symptoms so as ready for discharge   Short Term Goals: Ability to identify changes in lifestyle to reduce recurrence of condition will improve Ability to verbalize feelings will improve Ability to disclose and discuss suicidal ideas Ability to demonstrate self-control will improve Ability to identify  and develop effective coping behaviors will improve Ability to maintain clinical measurements within normal limits will improve Ability to identify triggers associated with substance abuse/mental health issues will improve Ability to identify changes in lifestyle to reduce recurrence of condition will improve Ability to verbalize feelings will improve Ability to disclose and discuss suicidal ideas Ability to demonstrate self-control will improve Ability to identify and  develop effective coping behaviors will improve Ability to maintain clinical measurements within normal limits will improve  Medication Management: Evaluate patient's response, side effects, and tolerance of medication regimen.  Therapeutic Interventions: 1 to 1 sessions, Unit Group sessions and Medication administration.  Evaluation of Outcomes: Adequate for Discharge  Physician Treatment Plan for Secondary Diagnosis: Principal Problem:   MDD (major depressive disorder), recurrent severe, without psychosis (HCC)  Long Term Goal(s): Improvement in symptoms so as ready for discharge Improvement in symptoms so as ready for discharge   Short Term Goals: Ability to identify changes in lifestyle to reduce recurrence of condition will improve Ability to verbalize feelings will improve Ability to disclose and discuss suicidal ideas Ability to demonstrate self-control will improve Ability to identify and develop effective coping behaviors will improve Ability to maintain clinical measurements within normal limits will improve Ability to identify triggers associated with substance abuse/mental health issues will improve Ability to identify changes in lifestyle to reduce recurrence of condition will improve Ability to verbalize feelings will improve Ability to disclose and discuss suicidal ideas Ability to demonstrate self-control will improve Ability to identify and develop effective coping behaviors will improve Ability to maintain clinical measurements within normal limits will improve     Medication Management: Evaluate patient's response, side effects, and tolerance of medication regimen.  Therapeutic Interventions: 1 to 1 sessions, Unit Group sessions and Medication administration.  Evaluation of Outcomes: Adequate for Discharge   RN Treatment Plan for Primary Diagnosis: MDD (major depressive disorder), recurrent severe, without psychosis (HCC) Long Term Goal(s): Knowledge of disease  and therapeutic regimen to maintain health will improve  Short Term Goals: Ability to participate in decision making will improve, Ability to verbalize feelings will improve, Ability to disclose and discuss suicidal ideas and Ability to identify and develop effective coping behaviors will improve  Medication Management: RN will administer medications as ordered by provider, will assess and evaluate patient's response and provide education to patient for prescribed medication. RN will report any adverse and/or side effects to prescribing provider.  Therapeutic Interventions: 1 on 1 counseling sessions, Psychoeducation, Medication administration, Evaluate responses to treatment, Monitor vital signs and CBGs as ordered, Perform/monitor CIWA, COWS, AIMS and Fall Risk screenings as ordered, Perform wound care treatments as ordered.  Evaluation of Outcomes: Adequate for Discharge   LCSW Treatment Plan for Primary Diagnosis: MDD (major depressive disorder), recurrent severe, without psychosis (HCC) Long Term Goal(s): Safe transition to appropriate next level of care at discharge, Engage patient in therapeutic group addressing interpersonal concerns.  Short Term Goals: Engage patient in aftercare planning with referrals and resources  Therapeutic Interventions: Assess for all discharge needs, 1 to 1 time with Social worker, Explore available resources and support systems, Assess for adequacy in community support network, Educate family and significant other(s) on suicide prevention, Complete Psychosocial Assessment, Interpersonal group therapy.  Evaluation of Outcomes: Adequate for Discharge   Progress in Treatment: Attending groups: No. Participating in groups: No. Taking medication as prescribed: Yes. Toleration medication: Yes. Family/Significant other contact made: Yes, individual(s) contacted:  patient's siter Patient understands diagnosis: Yes. Discussing patient identified problems/goals  with staff: Yes. Medical problems stabilized or resolved: Yes. Denies suicidal/homicidal ideation: Yes. Issues/concerns per patient self-inventory: No. Other:   New problem(s) identified: None   New Short Term/Long Term Goal(s):  medication stabilization, elimination of SI thoughts, development of comprehensive mental wellness plan.   Patient Goals:  I just want to go home, I was having a bad day  Discharge Plan or Barriers: Patient discharged home with her sister and plans to continue to follow up with her [facility] providers at [facility].   Reason for Continuation of Hospitalization: None   Estimated Length of Stay: Discharged, 06/24/18  Attendees: Patient: 06/24/2018 2:10 PM  Physician: Dr. Landry Mellow, MD 06/24/2018 2:10 PM  Nursing: Shelda Jakes, RN 06/24/2018 2:10 PM  RN Care Manager: Onnie Boer, RN 06/24/2018 2:10 PM  Social Worker: Baldo Daub, LCSWA 06/24/2018 2:10 PM  Recreational Therapist:  06/24/2018 2:10 PM  Other: Serena Colonel, NP 06/24/2018 2:10 PM  Other:  06/24/2018 2:10 PM  Other: 06/24/2018 2:10 PM    Scribe for Treatment Team: Maeola Sarah, LCSWA 06/24/2018 2:10 PM

## 2018-06-24 NOTE — Progress Notes (Signed)
Adult Psychoeducational Group Note  Date:  06/24/2018 Time:  5:09 AM  Group Topic/Focus:  Wrap-Up Group:   The focus of this group is to help patients review their daily goal of treatment and discuss progress on daily workbooks.  Participation Level:  Active  Participation Quality:  Appropriate and Attentive  Affect:  Appropriate  Cognitive:  Alert and Appropriate  Insight: Appropriate  Engagement in Group:  Engaged  Modes of Intervention:  Discussion  Additional Comments:  Pt attend wrap up group her day was a 7. The one positive thing that happened to her today her friends came to visit. This made her happy.  Charna Busmanobinson, Braylie Badami Long 06/24/2018, 5:09 AM

## 2018-06-24 NOTE — [facility] Suicide Risk Assessment (Signed)
[facility] Discharge Suicide Risk Assessment   Principal Problem: <principal problem not specified> Discharge Diagnoses: Active Problems:   MDD (major depressive disorder), recurrent severe, without psychosis ([place])   Total Time spent with patient: 15 minutes  Musculoskeletal: Strength & Muscle Tone: within normal limits Gait & Station: normal Patient leans: N/A  Psychiatric Specialty Exam: Review of Systems  Gastrointestinal: Positive for nausea.  All other systems reviewed and are negative.   Blood pressure (!) 130/103, pulse 92, temperature 98.3 F (36.8 C), temperature source Oral, resp. rate 18, height 5' 6"  (1.676 m), weight 74.4 kg.Body mass index is 26.47 kg/m.  General Appearance: Casual  Eye Contact::  Fair  Speech:  Normal Rate409  Volume:  Normal  Mood:  Anxious  Affect:  Congruent  Thought Process:  Coherent and Descriptions of Associations: Intact  Orientation:  Full (Time, Place, and Person)  Thought Content:  Logical  Suicidal Thoughts:  No  Homicidal Thoughts:  No  Memory:  Immediate;   Fair Recent;   Fair Remote;   Fair  Judgement:  Intact  Insight:  Fair  Psychomotor Activity:  Normal  Concentration:  Fair  Recall:  [employer] of Knowledge:Fair  Language: Good  Akathisia:  Negative  Handed:  Right  AIMS (if indicated):     Assets:  Communication Skills Desire for Improvement Financial Resources/Insurance Housing Physical Health Resilience Social Support  Sleep:  Number of Hours: 5.25  Cognition: WNL  ADL's:  Intact   Mental Status Per Nursing Assessment::   On Admission:  NA  Demographic Factors:  Caucasian  Loss Factors: NA  Historical Factors: Impulsivity  Risk Reduction Factors:   Employed, Living with another person, especially a relative, Positive social support and Positive therapeutic relationship  Continued Clinical Symptoms:  Bipolar Disorder:   Mixed State  Cognitive Features That Contribute To Risk:  None    Suicide  Risk:  Minimal: No identifiable suicidal ideation.  Patients presenting with no risk factors but with morbid ruminations; may be classified as minimal risk based on the severity of the depressive symptoms  Follow-up Information    [facility] Go on 07/01/2018.   Why:  Your next hospital follow up appointment is Friday, 07/01/18 at 8:30a.  Please bring: photo ID, proof of insurance, social security card, and any discharge paperwork from this hospitalization.  Contact information: [address] [place] [address] [phone]           Plan Of Care/Follow-up recommendations:  Activity:  ad lib  Sharma Covert, MD 06/24/2018, 9:09 AM

## 2018-06-24 NOTE — Progress Notes (Signed)
  [facility]  Will you be returning to the same living situation after discharge:  Yes,  patient reports that she is returning home with her sister At discharge, do you have transportation home?: Yes,  patient reports her sister will pick her up at discharge Do you have the ability to pay for your medications: No.  Release of information consent forms completed and in the chart;  Patient's signature needed at discharge.  Patient to Follow up at: Follow-up Information    [facility] Go on 07/01/2018.   Why:  Your next hospital follow up appointment is Friday, 07/01/18 at 8:30a.  Please bring: photo ID, proof of insurance, social security card, and any discharge paperwork from this hospitalization.  Contact information: [address] [place] [id number] [phone]           Next level of care provider has access to [facility]:yes  Safety Planning and Suicide Prevention discussed: Yes,  with the patient's sister  Have you used any form of tobacco in the last 30 days? (Cigarettes, Smokeless Tobacco, Cigars, and/or Pipes): Yes  Has patient been referred to the Quitline?: Patient refused referral  Patient has been referred for addiction treatment: N/A  Maeola SarahJolan E Lennon Richins, LCSWA 06/24/2018, 9:31 AM

## 2020-02-20 ENCOUNTER — Telehealth (HOSPITAL_COMMUNITY): Payer: Self-pay | Admitting: Psychiatric/Mental Health

## 2020-02-20 ENCOUNTER — Other Ambulatory Visit: Payer: Self-pay

## 2021-11-05 ENCOUNTER — Telehealth: Payer: Self-pay | Admitting: Internal Medicine

## 2021-11-05 NOTE — Telephone Encounter (Signed)
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 Pt mom is aware md not accepting new pt. Please advise ?

## 2021-11-05 NOTE — Telephone Encounter (Signed)
Last Ov 01/24/2016 ?Please advise ?

## 2021-11-10 NOTE — Telephone Encounter (Signed)
Thank you for your confidence but I am not taking on "new patients"   ? am now part time hours more limited hours  ?

## 2021-11-11 NOTE — Telephone Encounter (Signed)
Noted
